# Patient Record
Sex: Male | Born: 1975 | Race: White | Hispanic: No | Marital: Married | Smoking: Former smoker
Health system: Southern US, Community
[De-identification: ages and names within clinical notes are randomized; demographics above are authoritative.]

## PROBLEM LIST (undated history)

## (undated) DIAGNOSIS — E7800 Pure hypercholesterolemia, unspecified: Secondary | ICD-10-CM

## (undated) DIAGNOSIS — I2510 Atherosclerotic heart disease of native coronary artery without angina pectoris: Secondary | ICD-10-CM

## (undated) DIAGNOSIS — I2102 ST elevation (STEMI) myocardial infarction involving left anterior descending coronary artery: Secondary | ICD-10-CM

## (undated) DIAGNOSIS — I509 Heart failure, unspecified: Secondary | ICD-10-CM

## (undated) DIAGNOSIS — I10 Essential (primary) hypertension: Secondary | ICD-10-CM

## (undated) DIAGNOSIS — Z720 Tobacco use: Secondary | ICD-10-CM

## (undated) HISTORY — PX: CORONARY ANGIOPLASTY WITH STENT PLACEMENT: SHX49

## (undated) HISTORY — PX: APPENDECTOMY: SHX54

## (undated) HISTORY — PX: CORONARY ARTERY BYPASS GRAFT: SHX141

---

## 2006-12-20 ENCOUNTER — Emergency Department: Payer: Self-pay | Admitting: Emergency Medicine

## 2008-03-17 ENCOUNTER — Observation Stay: Payer: Self-pay | Admitting: General Surgery

## 2009-02-15 ENCOUNTER — Emergency Department: Payer: Self-pay | Admitting: Unknown Physician Specialty

## 2009-05-15 ENCOUNTER — Emergency Department: Payer: Self-pay | Admitting: Unknown Physician Specialty

## 2011-02-18 ENCOUNTER — Emergency Department: Payer: Self-pay | Admitting: Internal Medicine

## 2011-04-07 ENCOUNTER — Emergency Department: Payer: Self-pay

## 2014-08-12 DIAGNOSIS — I10 Essential (primary) hypertension: Secondary | ICD-10-CM | POA: Insufficient documentation

## 2014-08-12 DIAGNOSIS — Z8639 Personal history of other endocrine, nutritional and metabolic disease: Secondary | ICD-10-CM | POA: Insufficient documentation

## 2014-08-12 DIAGNOSIS — Z87891 Personal history of nicotine dependence: Secondary | ICD-10-CM | POA: Insufficient documentation

## 2014-10-20 DIAGNOSIS — I2102 ST elevation (STEMI) myocardial infarction involving left anterior descending coronary artery: Secondary | ICD-10-CM

## 2014-10-20 HISTORY — DX: ST elevation (STEMI) myocardial infarction involving left anterior descending coronary artery: I21.02

## 2014-10-31 DIAGNOSIS — I2510 Atherosclerotic heart disease of native coronary artery without angina pectoris: Secondary | ICD-10-CM | POA: Insufficient documentation

## 2015-08-04 ENCOUNTER — Emergency Department
Admission: EM | Admit: 2015-08-04 | Discharge: 2015-08-04 | Disposition: A | Discharge status: Home / Self Care | Payer: BLUE CROSS/BLUE SHIELD | Attending: Emergency Medicine | Admitting: Emergency Medicine

## 2015-08-04 DIAGNOSIS — Y9389 Activity, other specified: Secondary | ICD-10-CM | POA: Insufficient documentation

## 2015-08-04 DIAGNOSIS — Y998 Other external cause status: Secondary | ICD-10-CM | POA: Diagnosis not present

## 2015-08-04 DIAGNOSIS — S00412A Abrasion of left ear, initial encounter: Secondary | ICD-10-CM | POA: Insufficient documentation

## 2015-08-04 DIAGNOSIS — X58XXXA Exposure to other specified factors, initial encounter: Secondary | ICD-10-CM | POA: Diagnosis not present

## 2015-08-04 DIAGNOSIS — Y9289 Other specified places as the place of occurrence of the external cause: Secondary | ICD-10-CM | POA: Insufficient documentation

## 2015-08-04 DIAGNOSIS — S0991XA Unspecified injury of ear, initial encounter: Secondary | ICD-10-CM | POA: Diagnosis present

## 2015-08-04 NOTE — ED Provider Notes (Signed)
[facility] Emergency Department Provider Note  ____________________________________________  Time seen: Approximately 9:04 PM  I have reviewed the triage vital signs and the nursing notes.   HISTORY  Chief Complaint Ear Injury    HPI Gregg [name] is a 39 y.o. male who presents to the emergency room for bleeding cut to his ear. He states that he was scratching his ear prior to arrival when he accidentally "knicked" it and it started bleeding. The patient is on blood thinners for cardiac stent placement and the wound bled freely. He states that he applied direct pressure and could not initially get it to stop bleeding. He reports that it was bleeding for approximately an hour. He reports to the emergency room and states that the bleeding subsided while waiting for the provider. Denies any other injury or complaint. He denies any pain.   No past medical history on file.  There are no active problems to display for this patient.   No past surgical history on file.  No current outpatient prescriptions on file.  Allergies Review of patient's allergies indicates no known allergies.  No family history on file.  Social History Social History  Substance Use Topics  . Smoking status: Not on file  . Smokeless tobacco: Not on file  . Alcohol Use: Not on file    Review of Systems Constitutional: No fever/chills Eyes: No visual changes. ENT: No sore throat. Cardiovascular: Denies chest pain. Respiratory: Denies shortness of breath. Gastrointestinal: No abdominal pain.  No nausea, no vomiting.  No diarrhea.  No constipation. Genitourinary: Negative for dysuria. Musculoskeletal: Negative for back pain. Skin: Negative for rash. Positive for cut to left ear. Neurological: Negative for headaches, focal weakness or numbness.  10-point ROS otherwise negative.  ____________________________________________   PHYSICAL EXAM:  VITAL SIGNS: ED Triage  Vitals  Enc Vitals Group     BP 08/04/15 2016 119/83 mmHg     Pulse Rate 08/04/15 2016 73     Resp 08/04/15 2016 18     Temp 08/04/15 2016 97.7 F (36.5 C)     Temp Source 08/04/15 2016 Oral     SpO2 08/04/15 2016 96 %     Weight 08/04/15 2016 195 lb (88.451 kg)     Height 08/04/15 2016 5\' 6"  (1.676 m)     Head Cir --      Peak Flow --      Pain Score --      Pain Loc --      Pain Edu? --      Excl. in GC? --     Constitutional: Alert and oriented. Well appearing and in no acute distress. Eyes: Conjunctivae are normal. PERRL. EOMI. Head: Atraumatic. Nose: No congestion/rhinnorhea. Mouth/Throat: Mucous membranes are moist.  Oropharynx non-erythematous. Neck: No stridor.   Cardiovascular: Normal rate, regular rhythm. Grossly normal heart sounds.  Good peripheral circulation. Respiratory: Normal respiratory effort.  No retractions. Lungs CTAB. Gastrointestinal: Soft and nontender. No distention. No abdominal bruits. No CVA tenderness. Musculoskeletal: No lower extremity tenderness nor edema.  No joint effusions. Neurologic:  Normal speech and language. No gross focal neurologic deficits are appreciated. No gait instability. Skin:  Skin is warm, dry and intact. No rash noted. Small abrasion noted to left pinna. Bleeding is stopped at this time. No visible signs of infection. No tenderness to palpation. Abrasion is a surface deep. No cartilage involved. Psychiatric: Mood and affect are normal. Speech and behavior are normal.  ____________________________________________   LABS (all  labs ordered are listed, but only abnormal results are displayed)  Labs Reviewed - No data to display ____________________________________________  EKG   ____________________________________________  RADIOLOGY   ____________________________________________   PROCEDURES  Procedure(s) performed: yes, laceration repair, see procedure note(s).   LACERATION REPAIR Performed by: Racheal Patches Authorized by: Delorise Royals Krystena Reitter Consent: Verbal consent obtained. Risks and benefits: risks, benefits and alternatives were discussed Consent given by: patient Patient identity confirmed: provided demographic data Prepped and Draped in normal sterile fashion Wound explored  Laceration Location: Left external ear  Laceration Length: 0.5 cm  No Foreign Bodies seen or palpated  Anesthesia: None   Amount of cleaning: standard  Skin closure: Skin adhesive    Patient tolerance: Patient tolerated the procedure well with no immediate complications.   Critical Care performed: No  ____________________________________________   INITIAL IMPRESSION / ASSESSMENT AND PLAN / ED COURSE  Pertinent labs & imaging results that were available during my care of the patient were reviewed by me and considered in my medical decision making (see chart for details).  Patient presents to the emergency department with an abrasion to the left ear. Initially patient was unable to control bleeding due to location and the fact the patient was on blood thinners. However bleeding had stopped prior to interview with provider. Area was cleansed and closed with skin adhesive. Patient informed that he does not need to follow-up with emergency Department last bleeding resumes. Patient verbalizes understanding and compliance with same. ____________________________________________   FINAL CLINICAL IMPRESSION(S) / ED DIAGNOSES  Final diagnoses:  Ear abrasion, left, initial encounter      Racheal Patches, PA-C 08/04/15 2116  Darien Ramus, MD 08/04/15 2308

## 2015-08-04 NOTE — Discharge Instructions (Signed)
Abrasion An abrasion is a cut or scrape on the outer surface of your skin. An abrasion does not extend through all of the layers of your skin. It is important to care for your abrasion properly to prevent infection. CAUSES Most abrasions are caused by falling on or gliding across the ground or another surface. When your skin rubs on something, the outer and inner layer of skin rubs off.  SYMPTOMS A cut or scrape is the main symptom of this condition. The scrape may be bleeding, or it may appear red or pink. If there was an associated fall, there may be an underlying bruise. DIAGNOSIS An abrasion is diagnosed with a physical exam. TREATMENT Treatment for this condition depends on how large and deep the abrasion is. Usually, your abrasion will be cleaned with water and mild soap. This removes any dirt or debris that may be stuck. An antibiotic ointment may be applied to the abrasion to help prevent infection. A bandage (dressing) may be placed on the abrasion to keep it clean. You may also need a tetanus shot. HOME CARE INSTRUCTIONS Medicines  Take or apply medicines only as directed by your health care provider.  If you were prescribed an antibiotic ointment, finish all of it even if you start to feel better. Wound Care  Clean the wound with mild soap and water 2-3 times per day or as directed by your health care provider. Pat your wound dry with a clean towel. Do not rub it.  There are many different ways to close and cover a wound. Follow instructions from your health care provider about:  Wound care.  Dressing changes and removal.  Check your wound every day for signs of infection. Watch for:  Redness, swelling, or pain.  Fluid, blood, or pus. General Instructions  Keep the dressing dry as directed by your health care provider. Do not take baths, swim, use a hot tub, or do anything that would put your wound underwater until your health care provider approves.  If there is  swelling, raise (elevate) the injured area above the level of your heart while you are sitting or lying down.  Keep all follow-up visits as directed by your health care provider. This is important. SEEK MEDICAL CARE IF:  You received a tetanus shot and you have swelling, severe pain, redness, or bleeding at the injection site.  Your pain is not controlled with medicine.  You have increased redness, swelling, or pain at the site of your wound. SEEK IMMEDIATE MEDICAL CARE IF:  You have a red streak going away from your wound.  You have a fever.  You have fluid, blood, or pus coming from your wound.  You notice a bad smell coming from your wound or your dressing.   This information is not intended to replace advice given to you by your health care provider. Make sure you discuss any questions you have with your health care provider.   Document Released: 07/16/2005 Document Revised: 06/27/2015 Document Reviewed: 10/04/2014 Elsevier Interactive Patient Education 2016 [employer] Tissue Adhesive Wound Care    Some cuts, wounds, lacerations, and incisions can be repaired by using tissue adhesive. Tissue adhesive is like glue. It holds the skin together, allowing for faster healing. It forms a strong bond on the skin in about 1 minute and reaches its full strength in about 2 or 3 minutes. The adhesive disappears naturally while the wound is healing. It is important to take proper care of your wound at home  while it heals.  HOME CARE INSTRUCTIONS  Showers are allowed. Do not soak the area containing the tissue adhesive. Do not take baths, swim, or use hot tubs. Do not use any soaps or ointments on the wound. Certain ointments can weaken the glue.  If a bandage (dressing) has been applied, follow your health care provider's instructions for how often to change the dressing.  Keep the dressing dry if one has been applied.  Do not scratch, pick, or rub the adhesive.  Do not place tape over  the adhesive. The adhesive could come off when pulling the tape off.  Protect the wound from further injury until it is healed.  Protect the wound from sun and tanning bed exposure while it is healing and for several weeks after healing.  Only take over-the-counter or prescription medicines as directed by your health care provider.  Keep all follow-up appointments as directed by your health care provider. SEEK IMMEDIATE MEDICAL CARE IF:  Your wound becomes red, swollen, hot, or tender.  You develop a rash after the glue is applied.  You have increasing pain in the wound.  You have a red streak that goes away from the wound.  You have pus coming from the wound.  You have increased bleeding.  You have a fever.  You have shaking chills.  You notice a bad smell coming from the wound.  Your wound or adhesive breaks open.  MAKE SURE YOU:  Understand these instructions.  Will watch your condition.  Will get help right away if you are not doing well or get worse. This information is not intended to replace advice given to you by your health care provider. Make sure you discuss any questions you have with your health care provider.  Document Released: 04/01/2001 Document Revised: 07/27/2013 Document Reviewed: 04/27/2013  Elsevier Interactive Patient Education [employer]

## 2015-08-04 NOTE — ED Notes (Signed)
Pt reports scratching left ear last night. Bleeding stopped prior to going to bed. Reports bleeding started again at approx 7 pm tonight after shower. Unable to stop bleeding. Pt states he is on blood thinner.

## 2015-10-05 ENCOUNTER — Encounter: Payer: Self-pay | Admitting: Emergency Medicine

## 2015-10-05 ENCOUNTER — Emergency Department: Payer: BLUE CROSS/BLUE SHIELD

## 2015-10-05 ENCOUNTER — Emergency Department
Admission: EM | Admit: 2015-10-05 | Discharge: 2015-10-05 | Disposition: A | Discharge status: Other Acute Inpatient Hospital | Payer: BLUE CROSS/BLUE SHIELD | Attending: Emergency Medicine | Admitting: Emergency Medicine

## 2015-10-05 DIAGNOSIS — I214 Non-ST elevation (NSTEMI) myocardial infarction: Secondary | ICD-10-CM

## 2015-10-05 DIAGNOSIS — F17210 Nicotine dependence, cigarettes, uncomplicated: Secondary | ICD-10-CM | POA: Insufficient documentation

## 2015-10-05 DIAGNOSIS — I10 Essential (primary) hypertension: Secondary | ICD-10-CM | POA: Insufficient documentation

## 2015-10-05 DIAGNOSIS — R079 Chest pain, unspecified: Secondary | ICD-10-CM | POA: Diagnosis present

## 2015-10-05 HISTORY — DX: Tobacco use: Z72.0

## 2015-10-05 HISTORY — DX: Essential (primary) hypertension: I10

## 2015-10-05 HISTORY — DX: ST elevation (STEMI) myocardial infarction involving left anterior descending coronary artery: I21.02

## 2015-10-05 HISTORY — DX: Pure hypercholesterolemia, unspecified: E78.00

## 2015-10-05 HISTORY — DX: Atherosclerotic heart disease of native coronary artery without angina pectoris: I25.10

## 2015-10-05 LAB — MAGNESIUM: Magnesium: 1.8 mg/dL (ref 1.7–2.4)

## 2015-10-05 LAB — COMPREHENSIVE METABOLIC PANEL
ALBUMIN: 3.9 g/dL (ref 3.5–5.0)
ALT: 39 U/L (ref 17–63)
ANION GAP: 6 (ref 5–15)
AST: 26 U/L (ref 15–41)
Alkaline Phosphatase: 92 U/L (ref 38–126)
BILIRUBIN TOTAL: 0.6 mg/dL (ref 0.3–1.2)
BUN: 11 mg/dL (ref 6–20)
CO2: 28 mmol/L (ref 22–32)
Calcium: 9.6 mg/dL (ref 8.9–10.3)
Chloride: 106 mmol/L (ref 101–111)
Creatinine, Ser: 0.81 mg/dL (ref 0.61–1.24)
GFR calc Af Amer: >60 mL/min (ref >60)
GFR calc non Af Amer: >60 mL/min (ref >60)
Glucose, Bld: 111 mg/dL — ABNORMAL HIGH (ref 65–99)
POTASSIUM: 3.7 mmol/L (ref 3.5–5.1)
SODIUM: 140 mmol/L (ref 135–145)
TOTAL PROTEIN: 7.1 g/dL (ref 6.5–8.1)

## 2015-10-05 LAB — CBC
HEMATOCRIT: 49.5 % (ref 40.0–52.0)
Hemoglobin: 16.4 g/dL (ref 13.0–18.0)
MCH: 30 pg (ref 26.0–34.0)
MCHC: 33.1 g/dL (ref 32.0–36.0)
MCV: 90.6 fL (ref 80.0–100.0)
Platelets: 264 10*3/uL (ref 150–440)
RBC: 5.46 MIL/uL (ref 4.40–5.90)
RDW: 13.8 % (ref 11.5–14.5)
WBC: 9.8 10*3/uL (ref 3.8–10.6)

## 2015-10-05 LAB — APTT: APTT: 32 s (ref 24–36)

## 2015-10-05 LAB — PROTIME-INR
INR: 0.99
Prothrombin Time: 13.3 seconds (ref 11.4–15.0)

## 2015-10-05 LAB — TROPONIN I: TROPONIN I: 0.07 ng/mL — AB (ref <0.031)

## 2015-10-05 MED ORDER — NITROGLYCERIN 2 % TD OINT: 1.0000 [in_us] | TOPICAL_OINTMENT | Freq: Once | TRANSDERMAL | Status: DC

## 2015-10-05 MED ORDER — HEPARIN (PORCINE) IN NACL 100-0.45 UNIT/ML-% IJ SOLN
1050.0000 [IU]/h | INTRAMUSCULAR | Status: DC
  Administered 2015-10-05 (×2): 1050 [IU]/h via INTRAVENOUS
  Filled 2015-10-05: qty 250

## 2015-10-05 MED ORDER — NITROGLYCERIN 0.4 MG SL SUBL
0.4000 mg | SUBLINGUAL_TABLET | SUBLINGUAL | Status: DC | PRN
  Filled 2015-10-05: qty 1

## 2015-10-05 MED ORDER — MORPHINE SULFATE (PF) 4 MG/ML IV SOLN
4.0000 mg | Freq: Once | INTRAVENOUS | Status: AC
  Administered 2015-10-05: 4 mg via INTRAVENOUS
  Filled 2015-10-05: qty 1

## 2015-10-05 MED ORDER — ONDANSETRON HCL 4 MG/2ML IJ SOLN
4.0000 mg | Freq: Once | INTRAMUSCULAR | Status: AC
  Administered 2015-10-05: 4 mg via INTRAVENOUS
  Filled 2015-10-05: qty 2

## 2015-10-05 MED ORDER — HEPARIN BOLUS VIA INFUSION
4000.0000 [IU] | Freq: Once | INTRAVENOUS | Status: AC
  Administered 2015-10-05: 4000 [IU] via INTRAVENOUS
  Filled 2015-10-05: qty 4000

## 2015-10-05 MED ORDER — TICAGRELOR 90 MG PO TABS
180.0000 mg | ORAL_TABLET | ORAL | Status: AC
  Administered 2015-10-05: 180 mg via ORAL
  Filled 2015-10-05: qty 2

## 2015-10-05 MED ORDER — ASPIRIN 81 MG PO CHEW
324.0000 mg | CHEWABLE_TABLET | Freq: Once | ORAL | Status: AC
  Administered 2015-10-05: 324 mg via ORAL
  Filled 2015-10-05: qty 4

## 2015-10-05 NOTE — ED Notes (Signed)
Dr York CeriseForbach notified of pt's troponin results

## 2015-10-05 NOTE — ED Notes (Signed)
Pt in with co chest pain since yest, with shob, no diaphoresis.

## 2015-10-05 NOTE — ED Notes (Signed)
Pt uprite on stretcher in exam room with no distress noted; face flushed; pt reports hx angina; rx imdur 6mos ago with significant decrease in episodes; last few wks had returning of such; ran out of med yesterday and since has been having intermittent periods of upper CP radiating into neck/arms/wrist accomp by SOB; resp even/unlab, lungs clear, apical audible and regular

## 2015-10-05 NOTE — ED Provider Notes (Signed)
[facility] Emergency Department Provider Note  ____________________________________________  Time seen: Approximately 5:29 AM  I have reviewed the triage vital signs and the nursing notes.   HISTORY  Chief Complaint Chest Pain    HPI Gregg [name] is a 39 y.o. male with a history of severe CAD s/p STEMI last year with stent in LAD and known CAD in another vessel (patient cannot remember which one) who presents with severe central chest pain radiating into neck for the last several hours.  He reports he ran out of his Imdur and his NTG so he did not have any to take yesterday.  He had episodic angina throughout the day, which is common for him, but it did not get better immediately with rest.  During the night he had acute onset of severe pain, much worse than prior, which at rest, so he came to the ED.  Currently he has not chest pain free although it has improved from before.  He is having some shortness of breath associated with the sharp stabbing central chest pain.  He denies nausea/vomiting, abdominal pain, headache, fever/chills.  He has not had any recent cough or congestion.   Past Medical History  Diagnosis Date  . Hypertension   . High cholesterol   . ST elevation (STEMI) myocardial infarction involving left anterior descending coronary artery [facility]) Jan 2016  . Tobacco use   . Coronary artery disease     There are no active problems to display for this patient.   Past Surgical History  Procedure Laterality Date  . Appendectomy    . Coronary angioplasty with stent placement      No current outpatient prescriptions on file.  Allergies Review of patient's allergies indicates no known allergies.  History reviewed. No pertinent family history.  Social History Social History  Substance Use Topics  . Smoking status: Current Every Day Smoker -- 1.00 packs/day    Types: Cigarettes  . Smokeless tobacco: None  . Alcohol Use: Yes      Comment: occasional    Review of Systems Constitutional: No fever/chills Eyes: No visual changes. ENT: No sore throat. Cardiovascular: severe central sharp chest pain occurring at rest Respiratory: are as of breath associated with chest pain Gastrointestinal: No abdominal pain.  No nausea, no vomiting.  No diarrhea.  No constipation. Genitourinary: Negative for dysuria. Musculoskeletal: Negative for back pain. Skin: Negative for rash. Neurological: Negative for headaches, focal weakness or numbness.  10-point ROS otherwise negative.  ____________________________________________   PHYSICAL EXAM:  VITAL SIGNS: ED Triage Vitals  Enc Vitals Group     BP 10/05/15 0516 135/81 mmHg     Pulse Rate 10/05/15 0516 89     Resp 10/05/15 0516 18     Temp 10/05/15 0516 98 F (36.7 C)     Temp Source 10/05/15 0516 Oral     SpO2 10/05/15 0516 97 %     Weight 10/05/15 0515 205 lb (92.987 kg)     Height 10/05/15 0515  (1.676 m)     Head Cir --      Peak Flow --      Pain Score --      Pain Loc --      Pain Edu? --      Excl. in GC? --     Constitutional: Alert and oriented. Generally well-appearing but is somewhat uncomfortable Eyes: Conjunctivae are normal. PERRL. EOMI. Head: Atraumatic. Nose: No congestion/rhinnorhea. Mouth/Throat: Mucous membranes are moist.  Oropharynx  non-erythematous. Neck: No stridor.   Cardiovascular: Normal rate, regular rhythm. Grossly normal heart sounds.  Good peripheral circulation. Respiratory: Normal respiratory effort.  No retractions. Lungs CTAB. Gastrointestinal: Soft and nontender. No distention. No abdominal bruits. No CVA tenderness. Musculoskeletal: No lower extremity tenderness nor edema.  No joint effusions. Neurologic:  Normal speech and language. No gross focal neurologic deficits are appreciated.  Skin:  Skin is warm, dry and intact. No rash noted.   ____________________________________________   LABS (all labs ordered are  listed, but only abnormal results are displayed)  Labs Reviewed  COMPREHENSIVE METABOLIC PANEL - Abnormal; Notable for the following:    Glucose, Bld 111 (*)    All other components within normal limits  TROPONIN I - Abnormal; Notable for the following:    Troponin I 0.07 (*)    All other components within normal limits  APTT  CBC  MAGNESIUM  PROTIME-INR   ____________________________________________  EKG  ED ECG REPORT I, Aziya Arena, the attending physician, personally viewed and interpreted this ECG.  Date: 10/05/2015 EKG Time: 05:17 Rate: 72 Rhythm: normal sinus rhythm QRS Axis: normal Intervals: normal ST/T Wave abnormalities: Non-specific ST segment / T-wave changes; approximately 1 mm of ST depression in lead 1 and 2, inverted T-wave in lead 3, inverted T-wave in lead V6 Conduction Disutrbances: none Narrative Interpretation: non-specific but may represent acute ischemia or old change after STEMI last year  ____________________________________________  RADIOLOGY   Dg Chest 2 View  10/05/2015  CLINICAL DATA:  Acute onset of shortness of breath and facial flushing. Intermittent upper chest pain. Initial encounter. EXAM: CHEST  2 VIEW COMPARISON:  Chest radiograph performed 02/15/2009 FINDINGS: The lungs are well-aerated. Pulmonary vascularity is at the upper limits of normal. There is no evidence of focal opacification, pleural effusion or pneumothorax. The heart is normal in size; the mediastinal contour is within normal limits. No acute osseous abnormalities are seen. IMPRESSION: No acute cardiopulmonary process seen. Electronically Signed   By: Roanna RaiderJeffery  Chang M.D.   On: 10/05/2015 06:03    ____________________________________________   PROCEDURES  Procedure(s) performed: None  Critical Care performed: Yes, see critical care note(s)   CRITICAL CARE Performed by: Loleta RoseFORBACH, Merlin Ege   Total critical care time: 40 minutes  Critical care time was exclusive of  separately billable procedures and treating other patients.  Critical care was necessary to treat or prevent imminent or life-threatening deterioration.  Critical care was time spent personally by me on the following activities: development of treatment plan with patient and/or surrogate as well as nursing, discussions with consultants, evaluation of patient's response to treatment, examination of patient, obtaining history from patient or surrogate, ordering and performing treatments and interventions, ordering and review of laboratory studies, ordering and review of radiographic studies, pulse oximetry and re-evaluation of patient's condition.  ____________________________________________   INITIAL IMPRESSION / ASSESSMENT AND PLAN / ED COURSE  Pertinent labs & imaging results that were available during my care of the patient were reviewed by me and considered in my medical decision making (see chart for details).  Initially concerned for unstable angina, but +troponin returned while I was in the room with the patient.  Given the patient's history, I believe that he needs admission for NSTEMI.  [facility] and this is appropriate given that he has an existing patient at that facility.  I called and spoke with the transfer center and the patient was accepted by Dr. Ladonna SnideZach Loring (fellow) on behalf of Dr. Molly Maduroobert  Romeo Apple (attending).  He agreed with my current management of a full dose aspirin, heparin bolus and infusion, and nitroglycerin for chest pain.  He also asked that I order ticagrelor 180 mg to administer in the ED prior to transfer if possible, which I have done.    After 1 dose NTG 0.4 mg SL, the patient's chest pain resolved.  I have ordered 1 inch NTG paste.  Patient and wife are up to date with plan. ____________________________________________  FINAL CLINICAL IMPRESSION(S) / ED DIAGNOSES  Final diagnoses:  NSTEMI (non-ST elevated myocardial infarction) [facility])       NEW MEDICATIONS STARTED DURING THIS VISIT:  New Prescriptions   No medications on file     Loleta Rose, MD 10/05/15 [phone]

## 2015-10-07 DIAGNOSIS — I5022 Chronic systolic (congestive) heart failure: Secondary | ICD-10-CM | POA: Insufficient documentation

## 2015-10-07 DIAGNOSIS — I214 Non-ST elevation (NSTEMI) myocardial infarction: Secondary | ICD-10-CM | POA: Insufficient documentation

## 2015-10-11 HISTORY — PX: OTHER SURGICAL HISTORY: SHX169

## 2015-10-16 ENCOUNTER — Emergency Department: Payer: BLUE CROSS/BLUE SHIELD

## 2015-10-16 ENCOUNTER — Ambulatory Visit (HOSPITAL_COMMUNITY)
Admission: AD | Admit: 2015-10-16 | Discharge: 2015-10-16 | Disposition: A | Discharge status: Home / Self Care | Payer: BLUE CROSS/BLUE SHIELD | Source: Other Acute Inpatient Hospital | Attending: Emergency Medicine | Admitting: Emergency Medicine

## 2015-10-16 ENCOUNTER — Emergency Department
Admission: EM | Admit: 2015-10-16 | Discharge: 2015-10-16 | Disposition: A | Discharge status: Other Acute Inpatient Hospital | Payer: BLUE CROSS/BLUE SHIELD | Attending: Emergency Medicine | Admitting: Emergency Medicine

## 2015-10-16 DIAGNOSIS — R220 Localized swelling, mass and lump, head: Secondary | ICD-10-CM | POA: Diagnosis not present

## 2015-10-16 DIAGNOSIS — Y9289 Other specified places as the place of occurrence of the external cause: Secondary | ICD-10-CM | POA: Insufficient documentation

## 2015-10-16 DIAGNOSIS — I10 Essential (primary) hypertension: Secondary | ICD-10-CM | POA: Insufficient documentation

## 2015-10-16 DIAGNOSIS — I82402 Acute embolism and thrombosis of unspecified deep veins of left lower extremity: Secondary | ICD-10-CM | POA: Diagnosis not present

## 2015-10-16 DIAGNOSIS — Y658 Other specified misadventures during surgical and medical care: Secondary | ICD-10-CM | POA: Insufficient documentation

## 2015-10-16 DIAGNOSIS — R2243 Localized swelling, mass and lump, lower limb, bilateral: Secondary | ICD-10-CM | POA: Diagnosis present

## 2015-10-16 DIAGNOSIS — Y998 Other external cause status: Secondary | ICD-10-CM | POA: Diagnosis not present

## 2015-10-16 DIAGNOSIS — Y9389 Activity, other specified: Secondary | ICD-10-CM | POA: Diagnosis not present

## 2015-10-16 DIAGNOSIS — X58XXXA Exposure to other specified factors, initial encounter: Secondary | ICD-10-CM | POA: Diagnosis not present

## 2015-10-16 DIAGNOSIS — R778 Other specified abnormalities of plasma proteins: Secondary | ICD-10-CM

## 2015-10-16 DIAGNOSIS — R7989 Other specified abnormal findings of blood chemistry: Secondary | ICD-10-CM | POA: Insufficient documentation

## 2015-10-16 DIAGNOSIS — M9689 Other intraoperative and postprocedural complications and disorders of the musculoskeletal system: Secondary | ICD-10-CM | POA: Insufficient documentation

## 2015-10-16 DIAGNOSIS — M7989 Other specified soft tissue disorders: Secondary | ICD-10-CM | POA: Diagnosis present

## 2015-10-16 DIAGNOSIS — F17210 Nicotine dependence, cigarettes, uncomplicated: Secondary | ICD-10-CM | POA: Diagnosis not present

## 2015-10-16 DIAGNOSIS — L03114 Cellulitis of left upper limb: Secondary | ICD-10-CM | POA: Diagnosis not present

## 2015-10-16 DIAGNOSIS — R000 Tachycardia, unspecified: Secondary | ICD-10-CM | POA: Diagnosis not present

## 2015-10-16 DIAGNOSIS — J939 Pneumothorax, unspecified: Secondary | ICD-10-CM | POA: Diagnosis not present

## 2015-10-16 DIAGNOSIS — S8012XA Contusion of left lower leg, initial encounter: Secondary | ICD-10-CM | POA: Insufficient documentation

## 2015-10-16 HISTORY — DX: Heart failure, unspecified: I50.9

## 2015-10-16 LAB — COMPREHENSIVE METABOLIC PANEL
ALT: 33 U/L (ref 17–63)
ANION GAP: 8 (ref 5–15)
AST: 49 U/L — ABNORMAL HIGH (ref 15–41)
Albumin: 3.4 g/dL — ABNORMAL LOW (ref 3.5–5.0)
Alkaline Phosphatase: 73 U/L (ref 38–126)
BILIRUBIN TOTAL: 0.8 mg/dL (ref 0.3–1.2)
BUN: 17 mg/dL (ref 6–20)
CHLORIDE: 99 mmol/L — AB (ref 101–111)
CO2: 25 mmol/L (ref 22–32)
Calcium: 8.4 mg/dL — ABNORMAL LOW (ref 8.9–10.3)
Creatinine, Ser: 0.81 mg/dL (ref 0.61–1.24)
GFR calc Af Amer: >60 mL/min (ref >60)
GFR calc non Af Amer: >60 mL/min (ref >60)
GLUCOSE: 102 mg/dL — AB (ref 65–99)
POTASSIUM: 3.8 mmol/L (ref 3.5–5.1)
Sodium: 132 mmol/L — ABNORMAL LOW (ref 135–145)
Total Protein: 6.8 g/dL (ref 6.5–8.1)

## 2015-10-16 LAB — APTT: aPTT: 27 seconds (ref 24–36)

## 2015-10-16 LAB — PROTIME-INR
INR: 1.28
Prothrombin Time: 16.1 seconds — ABNORMAL HIGH (ref 11.4–15.0)

## 2015-10-16 LAB — URINALYSIS COMPLETE WITH MICROSCOPIC (ARMC ONLY)
Bacteria, UA: NONE SEEN
Bilirubin Urine: NEGATIVE
Glucose, UA: NEGATIVE mg/dL
Hgb urine dipstick: NEGATIVE
KETONES UR: NEGATIVE mg/dL
Leukocytes, UA: NEGATIVE
NITRITE: NEGATIVE
PROTEIN: NEGATIVE mg/dL
RBC / HPF: NONE SEEN RBC/hpf (ref 0–5)
SPECIFIC GRAVITY, URINE: 1.042 — AB (ref 1.005–1.030)
Squamous Epithelial / LPF: NONE SEEN
pH: 6 (ref 5.0–8.0)

## 2015-10-16 LAB — CBC WITH DIFFERENTIAL/PLATELET
BASOS ABS: 0.1 10*3/uL (ref 0–0.1)
BASOS PCT: 1 %
EOS ABS: 0.3 10*3/uL (ref 0–0.7)
Eosinophils Relative: 3 %
HCT: 32.1 % — ABNORMAL LOW (ref 40.0–52.0)
HEMOGLOBIN: 10.8 g/dL — AB (ref 13.0–18.0)
LYMPHS ABS: 1.6 10*3/uL (ref 1.0–3.6)
LYMPHS PCT: 12 %
MCH: 30.1 pg (ref 26.0–34.0)
MCHC: 33.6 g/dL (ref 32.0–36.0)
MCV: 89.5 fL (ref 80.0–100.0)
MONOS PCT: 14 %
Monocytes Absolute: 1.8 10*3/uL — ABNORMAL HIGH (ref 0.2–1.0)
NEUTROS ABS: 9.3 10*3/uL — AB (ref 1.4–6.5)
NEUTROS PCT: 70 %
Platelets: 329 10*3/uL (ref 150–440)
RBC: 3.59 MIL/uL — AB (ref 4.40–5.90)
RDW: 13.6 % (ref 11.5–14.5)
WBC: 13 10*3/uL — ABNORMAL HIGH (ref 3.8–10.6)

## 2015-10-16 LAB — BLOOD GAS, VENOUS
PCO2 VEN: 39 mmHg — AB (ref 44.0–60.0)
PH VEN: 7.45 — AB (ref 7.320–7.430)
Patient temperature: 37

## 2015-10-16 LAB — TROPONIN I: Troponin I: 0.57 ng/mL — ABNORMAL HIGH (ref <0.031)

## 2015-10-16 LAB — LACTIC ACID, PLASMA: LACTIC ACID, VENOUS: 0.9 mmol/L (ref 0.5–2.0)

## 2015-10-16 MED ORDER — SODIUM CHLORIDE 0.9 % IV SOLN
Freq: Once | INTRAVENOUS | Status: AC
  Administered 2015-10-16: 12:00:00 via INTRAVENOUS

## 2015-10-16 MED ORDER — ADENOSINE 6 MG/2ML IV SOLN: 6.0000 mg | Freq: Once | INTRAVENOUS | Status: DC

## 2015-10-16 MED ORDER — HYDROMORPHONE HCL 1 MG/ML IJ SOLN
INTRAMUSCULAR | Status: AC
  Administered 2015-10-16: 1 mg via INTRAVENOUS
  Filled 2015-10-16: qty 1

## 2015-10-16 MED ORDER — HEPARIN (PORCINE) IN NACL 100-0.45 UNIT/ML-% IJ SOLN: 12.0000 [IU]/kg/h | Freq: Once | INTRAMUSCULAR | Status: DC

## 2015-10-16 MED ORDER — HEPARIN (PORCINE) IN NACL 100-0.45 UNIT/ML-% IJ SOLN
17.0000 [IU]/kg/h | INTRAMUSCULAR | Status: DC
  Administered 2015-10-16: 17 [IU]/kg/h via INTRAVENOUS
  Filled 2015-10-16: qty 250

## 2015-10-16 MED ORDER — IOHEXOL 350 MG/ML SOLN
75.0000 mL | Freq: Once | INTRAVENOUS | Status: AC | PRN
  Administered 2015-10-16: 75 mL via INTRAVENOUS

## 2015-10-16 MED ORDER — HEPARIN SODIUM (PORCINE) 5000 UNIT/ML IJ SOLN: 4000.0000 [IU] | Freq: Once | INTRAMUSCULAR | Status: DC

## 2015-10-16 MED ORDER — HEPARIN BOLUS VIA INFUSION
5000.0000 [IU] | Freq: Once | INTRAVENOUS | Status: AC
  Administered 2015-10-16: 5000 [IU] via INTRAVENOUS
  Filled 2015-10-16: qty 5000

## 2015-10-16 MED ORDER — HYDROMORPHONE HCL 1 MG/ML IJ SOLN
1.0000 mg | Freq: Once | INTRAMUSCULAR | Status: AC
  Administered 2015-10-16: 1 mg via INTRAVENOUS

## 2015-10-16 NOTE — ED Provider Notes (Addendum)
[facility] Emergency Department Provider Note     Time seen: ----------------------------------------- 9:45 AM on 10/16/2015 -----------------------------------------    I have reviewed the triage vital signs and the nursing notes.   HISTORY  Chief Complaint Leg Swelling and Post-op Problem    HPI Gregg [name] is a 39 y.o. male who presents status post leg swelling and bilateral arm pain with swelling. Patient had cardiac bypass on Thursday last week at [facility]. Patient states when he left he began having right hand and left arm erythema and pain. Patient was given a heat pack and was told this was normal. He subsequently went home setting increased pain and swelling of the arms is noted ecchymosis in his left lower extremity. Left legs where they took the vein graft. Patient denies any fevers, has had no chest pain but does have some shortness of breath.   Past Medical History  Diagnosis Date  . Hypertension   . High cholesterol   . ST elevation (STEMI) myocardial infarction involving left anterior descending coronary artery [facility]) Jan 2016  . Tobacco use   . Coronary artery disease     There are no active problems to display for this patient.   Past Surgical History  Procedure Laterality Date  . Appendectomy    . Coronary angioplasty with stent placement    . Cardiac bi pass  10/11/2015    Allergies Review of patient's allergies indicates no known allergies.  Social History Social History  Substance Use Topics  . Smoking status: Current Every Day Smoker -- 1.00 packs/day    Types: Cigarettes  . Smokeless tobacco: None  . Alcohol Use: Yes     Comment: occasional    Review of Systems Constitutional: Negative for fever. Eyes: Negative for visual changes. ENT: Negative for sore throat. Cardiovascular: Negative for chest pain. Respiratory: Positive for shortness of breath Gastrointestinal: Negative for abdominal pain, vomiting and  diarrhea. Genitourinary: Negative for dysuria. Musculoskeletal: Negative for back pain. Positive for edema in bilateral upper extremities, much worse in the left. Skin: Positive for left arm erythema and right hand erythema, positive for ecchymosis left lower extremity Neurological: Negative for headaches, focal weakness or numbness.  10-point ROS otherwise negative.  ____________________________________________   PHYSICAL EXAM:  VITAL SIGNS: ED Triage Vitals  Enc Vitals Group     BP 10/16/15 0910 124/78 mmHg     Pulse Rate 10/16/15 0910 111     Resp 10/16/15 0910 18     Temp 10/16/15 0910 99.7 F (37.6 C)     Temp Source 10/16/15 0910 Oral     SpO2 10/16/15 0910 91 %     Weight 10/16/15 0910 205 lb (92.987 kg)     Height 10/16/15 0910  (1.676 m)     Head Cir --      Peak Flow --      Pain Score 10/16/15 0912 8     Pain Loc --      Pain Edu? --      Excl. in GC? --     Constitutional: Alert and oriented. Well appearing and in no distress. Eyes: Conjunctivae are normal. PERRL. Normal extraocular movements. ENT   Head: Normocephalic and atraumatic.   Nose: No congestion/rhinnorhea.   Mouth/Throat: Mucous membranes are moist.   Neck: No stridor. Cardiovascular: Rapid rate, regular rhythm. Normal and symmetric distal pulses are present in all extremities. No murmurs, rubs, or gallops. Respiratory: Normal respiratory effort without tachypnea nor retractions. Breath sounds are  clear and equal bilaterally. No wheezes/rales/rhonchi. Gastrointestinal: Soft and nontender. No distention. No abdominal bruits.  Musculoskeletal: Left upper extremity edema and erythema, there is pain with range of motion left upper extremity. Right hand erythema and edema is noted as well. Left lower extremity ecchymosis. Good peripheral pulses. Neurologic:  Normal speech and language. No gross focal neurologic deficits are appreciated. Speech is normal. No gait instability. Skin:   Marked erythema over the dorsum of the right hand and over the antecubital fossa that tracked superiorly consistent with either cellulitis or phlebitis. Left upper extremity is swollen and tender to touch as is the dorsum of the right hand. His left lower extremity medially has extensive ecchymosis likely status post vein excision Psychiatric: Mood and affect are normal. Speech and behavior are normal. Patient exhibits appropriate insight and judgment. ____________________________________________  EKG: Interpreted by me. Sinus tachycardia with a rate of 109 bpm, normal PR interval, normal QRS with, long QT interval. Nonspecific T-wave changes.  ____________________________________________  ED COURSE:  Pertinent labs & imaging results that were available during my care of the patient were reviewed by me and considered in my medical decision making (see chart for details). Patient with apparent cellulitis and/or phlebitis. I will obtain labs and he will likely need readmission ____________________________________________    LABS (pertinent positives/negatives)  Labs Reviewed  COMPREHENSIVE METABOLIC PANEL - Abnormal; Notable for the following:    Sodium 132 (*)    Chloride 99 (*)    Glucose, Bld 102 (*)    Calcium 8.4 (*)    Albumin 3.4 (*)    AST 49 (*)    All other components within normal limits  TROPONIN I - Abnormal; Notable for the following:    Troponin I 0.57 (*)    All other components within normal limits  CBC WITH DIFFERENTIAL/PLATELET - Abnormal; Notable for the following:    WBC 13.0 (*)    RBC 3.59 (*)    Hemoglobin 10.8 (*)    HCT 32.1 (*)    Neutro Abs 9.3 (*)    Monocytes Absolute 1.8 (*)    All other components within normal limits  BLOOD GAS, VENOUS - Abnormal; Notable for the following:    pH, Ven 7.45 (*)    pCO2, Ven 39 (*)    All other components within normal limits  URINE CULTURE  CULTURE, BLOOD (ROUTINE X 2)  CULTURE, BLOOD (ROUTINE X 2)  LACTIC  ACID, PLASMA  LACTIC ACID, PLASMA  URINALYSIS COMPLETEWITH MICROSCOPIC ([facility] ONLY)   CRITICAL CARE Performed by: Emily Filbert   Total critical care time: 30 minutes  Critical care time was exclusive of separately billable procedures and treating other patients.  Critical care was necessary to treat or prevent imminent or life-threatening deterioration.  Critical care was time spent personally by me on the following activities: development of treatment plan with patient and/or surrogate as well as nursing, discussions with consultants, evaluation of patient's response to treatment, examination of patient, obtaining history from patient or surrogate, ordering and performing treatments and interventions, ordering and review of laboratory studies, ordering and review of radiographic studies, pulse oximetry and re-evaluation of patient's condition.   RADIOLOGY Images were viewed by me  Chest x-ray, upper extremity Doppler. IMPRESSION: No evidence of deep venous thrombosis on the right. On the left, there is acute deep venous thrombosis in a branch of the left brachial vein, a deep structure. There is acute thrombus in much of the left cephalic and basilic veins, superficial venous  structures. The remainder the left upper extremity venous system appears Unremarkable. IMPRESSION: No demonstrable pulmonary embolus.  Small pneumothorax in the left apical region without tension component.  Air in the lower anterior mediastinum is likely postoperative in etiology. A small amount of air in the pericardial region likewise is felt to be of postoperative etiology. If patient continues to have clinical symptoms, a follow-up CT to assess for resolution of these foci of air would certainly be warranted ; conceivably, infectious lesion could present in this manner.  There is bibasilar airspace consolidation with small effusions. This finding is likely due to compressive atelectasis  given the recent surgery, although a degree of superimposed pneumonia cannot be excluded radiographically.  5 mm nodular opacity left upper lobe. Followup of this nodular opacity should be based on Fleischner Society guidelines. If the patient is at high risk for bronchogenic carcinoma, follow-up chest CT at 6-12 months is recommended. If the patient is at low risk for bronchogenic carcinoma, follow-up chest CT at 12 months is recommended. This recommendation follows the consensus statement: Guidelines for Management of Small Pulmonary Nodules Detected on CT Scans: A Statement from the Fleischner Society as published in Radiology 2005;237:395-400.  Small mediastinal lymph nodes are likely of postoperative etiology. No frank adenopathy by size criteria. ____________________________________________  FINAL ASSESSMENT AND PLAN  Postop swelling and pain, cellulitis, DVT  Plan: Patient with labs and imaging as dictated above. Patient with acute DVT in the left upper extremity. We'll obtain CT angiogram for further evaluation.   Emily FilbertWilliams, Jonathan E, MD She has been accepted in transfer by Dr. Kennon RoundsHaney. He is currently stable and on a heparin drip.  Emily FilbertJonathan E Williams, MD 10/16/15 1222  Emily FilbertJonathan E Williams, MD 10/16/15 1256  Emily FilbertJonathan E Williams, MD 10/16/15 [phone]

## 2015-10-16 NOTE — ED Notes (Signed)
Pt to US.

## 2015-10-16 NOTE — Progress Notes (Signed)
ANTICOAGULATION CONSULT NOTE - Initial Consult  Pharmacy Consult for heparin drip Indication: VTE treatment  No Known Allergies  Patient Measurements: Height: 5\' 6"  (167.6 cm) Weight: 205 lb (92.987 kg) IBW/kg (Calculated) : 63.8 Heparin Dosing Weight: 83.7 kg  Vital Signs: Temp: 99.7 F (37.6 C) (12/27 0914) Temp Source: Oral (12/27 0914) BP: 113/56 mmHg (12/27 1000) Pulse Rate: 103 (12/27 1000)  Labs:  Recent Labs  10/16/15 0942  HGB 10.8*  HCT 32.1*  PLT 329  CREATININE 0.81  TROPONINI 0.57*   Estimated Creatinine Clearance: 128.2 mL/min (by C-G formula based on Cr of 0.81).  Assessment: Pharmacy consulted to dose heparin in this 39 year old male for VTE treatment. Patient presents with leg swelling and bilateral arm pain/swelling. Dopplar reveals DVT of left brachial vein. Patient was not taking anticoagulants prior to admission per med rec.   Heparin dosing weight = 83.7 kg  Goal of Therapy:  Heparin level 0.3-0.7 units/ml Monitor platelets by anticoagulation protocol: Yes   Plan:  Give 5000 units bolus x 1 (~ 60 units/kg) Start heparin infusion at 1400 units/hr (~17 units/kg/hr) Check anti-Xa level in 6 hours and daily while on heparin Continue to monitor H&H and platelets  Pharmacy will continue to monitor, thank you for the consult.  Jodelle RedMary M Treylan Mcclintock 10/16/2015,1:29 PM

## 2015-10-16 NOTE — ED Notes (Signed)
[facility] and is c/o leg swelling and has noted redness BL arms with swelling..Marland Kitchen

## 2015-10-17 DIAGNOSIS — I82A12 Acute embolism and thrombosis of left axillary vein: Secondary | ICD-10-CM | POA: Insufficient documentation

## 2015-10-18 LAB — URINE CULTURE: Culture: NO GROWTH

## 2015-10-21 LAB — CULTURE, BLOOD (ROUTINE X 2)
CULTURE: NO GROWTH
CULTURE: NO GROWTH

## 2015-12-20 ENCOUNTER — Encounter: Payer: BLUE CROSS/BLUE SHIELD | Attending: Cardiology | Admitting: *Deleted

## 2015-12-20 ENCOUNTER — Encounter: Payer: Self-pay | Admitting: *Deleted

## 2015-12-20 VITALS — BP 138/82 | HR 78 | Ht 65.6 in | Wt 195.8 lb

## 2015-12-20 DIAGNOSIS — Z951 Presence of aortocoronary bypass graft: Secondary | ICD-10-CM | POA: Diagnosis not present

## 2015-12-20 NOTE — Patient Instructions (Signed)
Patient Instructions  Patient Details  Name: Gregg [name] MRN: [id number] Date of Birth: 1975-12-29 Referring Provider:  Abelina Bachelor, MD  Below are the personal goals you chose as well as exercise and nutrition goals. Our goal is to help you keep on track towards obtaining and maintaining your goals. We will be discussing your progress on these goals with you throughout the program.  Initial Exercise Prescription:     Initial Exercise Prescription - 12/20/15 1400    Date of Initial Exercise Prescription   Date 12/20/15   Treadmill   MPH 3   Grade 0   Minutes 10   Recumbant Bike   Level 2   RPM 40   Watts 20   Minutes 10   NuStep   Level 2   Watts 50   Minutes 10   Arm Ergometer   Level 1   Watts 10   Minutes 10   Recumbant Elliptical   Level 3   RPM 45   Watts 25   Minutes 10   Elliptical   Level 1   Speed 3   Minutes 1   REL-XR   Level 3   Watts 60   Minutes 10   T5 Nustep   Level 2   Watts 25   Minutes 10   Biostep-RELP   Level 3   Watts 60   Minutes 10   Prescription Details   Frequency (times per week) 3   Duration Progress to 30 minutes of continuous aerobic without signs/symptoms of physical distress   Intensity   THRR REST +  30   Ratings of Perceived Exertion 11-15   Perceived Dyspnea 2-4   Progression   Progression Continue progressive overload as per policy without signs/symptoms or physical distress.   Resistance Training   Training Prescription Yes   Weight 2   Reps 10-15      Exercise Goals: Frequency: Be able to perform aerobic exercise three times per week working toward 3-5 days per week.  Intensity: Work with a perceived exertion of 11 (fairly light) - 15 (hard) as tolerated. Follow your new exercise prescription and watch for changes in prescription as you progress with the program. Changes will be reviewed with you when they are made.  Duration: You should be able to do 30 minutes of continuous aerobic exercise  in addition to a 5 minute warm-up and a 5 minute cool-down routine.  Nutrition Goals: Your personal nutrition goals will be established when you do your nutrition analysis with the dietician.  The following are nutrition guidelines to follow: Cholesterol < /day Sodium < /day Fiber: Men under 50 yrs - 38 grams per day  Personal Goals:     Personal Goals and Risk Factors at Admission - 12/20/15 1433    Core Components/Risk Factors/Patient Goals on Admission   Tobacco Cessation Yes   Expected Outcomes Long Term: Complete abstinence from all tobacco products for at least 12 months from quit date.  Short term-cont to abstain for smoking-Quit Oct 05, 2015.   Hypertension Yes   Intervention Provide education on lifestyle modifcations including regular physical activity/exercise, weight management, moderate sodium restriction and increased consumption of fresh fruit, vegetables, and low fat dairy, alcohol moderation, and smoking cessation.;Monitor prescription use compliance.   Expected Outcomes Short Term: Continued assessment and intervention until BP is < 140/47mm HG in hypertensive participants. < 130/91mm HG in hypertensive participants with diabetes, heart failure or chronic kidney disease.;Long Term: Maintenance of blood pressure at  goal levels.   Lipids Yes   Intervention Provide education and support for participant on nutrition & aerobic/resistive exercise along with prescribed medications to achieve LDL 70mg , HDL >40mg .   Expected Outcomes Short Term: Participant states understanding of desired cholesterol values and is compliant with medications prescribed. Participant is following exercise prescription and nutrition guidelines.;Long Term: Cholesterol controlled with medications as prescribed, with individualized exercise RX and with personalized nutrition plan. Value goals: LDL < , HDL > 40 mg.      Tobacco Use Initial Evaluation: History  Smoking status  . Former  Smoker -- 1.50 packs/day for 20 years  . Types: Cigarettes  . Quit date: 10/05/2015  Smokeless tobacco  . Not on file    Copy of goals given to participant.

## 2015-12-20 NOTE — Progress Notes (Signed)
Cardiac Individual Treatment Plan  Patient Details  Name: Gregg [name] MRN: [id number] Date of Birth: 1976-01-23 Referring Provider:  Abelina Bachelor, MD  Initial Encounter Date:    Visit Diagnosis: S/P CABG x 3  Patient's Home Medications on Admission:  Current outpatient prescriptions:  .  amiodarone (PACERONE) 200 MG tablet, Take 1 tablet by mouth daily., Disp: , Rfl:  .  aspirin EC 81 MG tablet, Take 81 mg by mouth daily., Disp: , Rfl:  .  atorvastatin (LIPITOR) 80 MG tablet, Take 1 tablet by mouth daily., Disp: , Rfl:  .  furosemide (LASIX) 20 MG tablet, Take 1 tablet by mouth daily., Disp: , Rfl:  .  gabapentin (NEURONTIN) 100 MG capsule, Take 1 capsule by mouth 3 (three) times daily., Disp: , Rfl:  .  metoprolol succinate (TOPROL-XL) 50 MG 24 hr tablet, Take 1 tablet by mouth daily., Disp: , Rfl:  .  oxyCODONE (OXY IR/ROXICODONE) 5 MG immediate release tablet, Take 1 tablet by mouth every 4 (four) hours as needed. , Disp: , Rfl:   Past Medical History: Past Medical History  Diagnosis Date  . Hypertension   . High cholesterol   . ST elevation (STEMI) myocardial infarction involving left anterior descending coronary artery [facility]) Jan 2016  . Tobacco use   . Coronary artery disease   . CHF (congestive heart failure) (HCC)     Tobacco Use: History  Smoking status  . Former Smoker -- 1.50 packs/day for 20 years  . Types: Cigarettes  . Quit date: 10/05/2015  Smokeless tobacco  . Not on file    Labs: Recent Review Flowsheet Data    There is no flowsheet data to display.       Exercise Target Goals:    Exercise Program Goal: Individual exercise prescription set with THRR, safety & activity barriers. Participant demonstrates ability to understand and report RPE using BORG scale, to self-measure pulse accurately, and to acknowledge the importance of the exercise prescription.  Exercise Prescription Goal: Starting with aerobic activity 30 plus minutes a  day, 3 days per week for initial exercise prescription. Provide home exercise prescription and guidelines that participant acknowledges understanding prior to discharge.  Activity Barriers & Risk Stratification:     Activity Barriers & Cardiac Risk Stratification - 12/20/15 1425    Activity Barriers & Cardiac Risk Stratification   Activity Barriers None   Cardiac Risk Stratification High      6 Minute Walk:     6 Minute Walk      12/20/15 1430       6 Minute Walk   Phase Initial     Distance 1520 feet     Walk Time 6 minutes     RPE 9     Resting HR 78 bpm     Resting BP 138/82 mmHg     Max Ex. HR 110 bpm     Max Ex. BP 148/84 mmHg        Initial Exercise Prescription:   Exercise Prescription Changes:   Discharge Exercise Prescription (Final Exercise Prescription Changes):   Nutrition:  Target Goals: Understanding of nutrition guidelines, daily intake of sodium 1500mg , cholesterol 200mg , calories 30% from fat and 7% or less from saturated fats, daily to have 5 or more servings of fruits and vegetables.  Biometrics:     Pre Biometrics - 12/20/15 1432    Pre Biometrics   Height 5' 5.6" (1.666 m)   Weight 195 lb 12.8 oz (88.814 kg)  Waist Circumference 41 inches   Hip Circumference 39.25 inches   Waist to Hip Ratio 1.04 %   BMI (Calculated) 32.1       Nutrition Therapy Plan and Nutrition Goals:     Nutrition Therapy & Goals - 12/20/15 1432    Nutrition Therapy   Drug/Food Interactions Statins/Certain Fruits   Intervention Plan   Intervention Prescribe, educate and counsel regarding individualized specific dietary modifications aiming towards targeted core components such as weight, hypertension, lipid management, diabetes, heart failure and other comorbidities.   Expected Outcomes Short Term Goal: Understand basic principles of dietary content, such as calories, fat, sodium, cholesterol and nutrients.;Long Term Goal: Adherence to prescribed nutrition  plan.      Nutrition Discharge: Rate Your Plate Scores:   Nutrition Goals Re-Evaluation:   Psychosocial: Target Goals: Acknowledge presence or absence of depression, maximize coping skills, provide positive support system. Participant is able to verbalize types and ability to use techniques and skills needed for reducing stress and depression.  Initial Review & Psychosocial Screening:     Initial Psych Review & Screening - 12/20/15 1441    Family Dynamics   Comments Gregg [name] reports he has no desire to quit smoking even though his wife still smokes. He said he felt like he couldn't breath when he woke up from his CABG plus his Dad died from COPD. Gregg [name] reports his father had no quality of life his last couple of years before he died since he was at home with oxygen.       Quality of Life Scores:     Quality of Life - 12/20/15 1439    Quality of Life Scores   Health/Function Pre 24.7 %   Socioeconomic Pre 24.14 %   Psych/Spiritual Pre 27.93 %   Family Pre 26.4 %   GLOBAL Pre 25.5 %      PHQ-9:     Recent Review Flowsheet Data    Depression screen [facility] 2/9 12/20/2015   Decreased Interest 0   Down, Depressed, Hopeless 0   PHQ - 2 Score 0   Altered sleeping 0   Tired, decreased energy 1   Change in appetite 2   Feeling bad or failure about yourself  0   Trouble concentrating 0   Moving slowly or fidgety/restless 0   Suicidal thoughts 0   PHQ-9 Score 3   Difficult doing work/chores Not difficult at all      Psychosocial Evaluation and Intervention:   Psychosocial Re-Evaluation:   Vocational Rehabilitation: Provide vocational rehab assistance to qualifying candidates.   Vocational Rehab Evaluation & Intervention:     Vocational Rehab - 12/20/15 1426    Initial Vocational Rehab Evaluation & Intervention   Assessment shows need for Vocational Rehabilitation No      Education: Education Goals: Education classes will be provided on a weekly basis, covering  required topics. Participant will state understanding/return demonstration of topics presented.  Learning Barriers/Preferences:     Learning Barriers/Preferences - 12/20/15 1425    Learning Barriers/Preferences   Learning Barriers None   Learning Preferences None      Education Topics: General Nutrition Guidelines/Fats and Fiber: -Group instruction provided by verbal, written material, models and posters to present the general guidelines for heart healthy nutrition. Gives an explanation and review of dietary fats and fiber.   Controlling Sodium/Reading Food Labels: -Group verbal and written material supporting the discussion of sodium use in heart healthy nutrition. Review and explanation with models, verbal and written materials for utilization  of the food label.   Exercise Physiology & Risk Factors: - Group verbal and written instruction with models to review the exercise physiology of the cardiovascular system and associated critical values. Details cardiovascular disease risk factors and the goals associated with each risk factor.   Aerobic Exercise & Resistance Training: - Gives group verbal and written discussion on the health impact of inactivity. On the components of aerobic and resistive training programs and the benefits of this training and how to safely progress through these programs.   Flexibility, Balance, General Exercise Guidelines: - Provides group verbal and written instruction on the benefits of flexibility and balance training programs. Provides general exercise guidelines with specific guidelines to those with heart or lung disease. Demonstration and skill practice provided.   Stress Management: - Provides group verbal and written instruction about the health risks of elevated stress, cause of high stress, and healthy ways to reduce stress.   Depression: - Provides group verbal and written instruction on the correlation between heart/lung disease and  depressed mood, treatment options, and the stigmas associated with seeking treatment.   Anatomy & Physiology of the Heart: - Group verbal and written instruction and models provide basic cardiac anatomy and physiology, with the coronary electrical and arterial systems. Review of: AMI, Angina, Valve disease, Heart Failure, Cardiac Arrhythmia, Pacemakers, and the ICD.   Cardiac Procedures: - Group verbal and written instruction and models to describe the testing methods done to diagnose heart disease. Reviews the outcomes of the test results. Describes the treatment choices: Medical Management, Angioplasty, or Coronary Bypass Surgery.   Cardiac Medications: - Group verbal and written instruction to review commonly prescribed medications for heart disease. Reviews the medication, class of the drug, and side effects. Includes the steps to properly store meds and maintain the prescription regimen.   Go Sex-Intimacy & Heart Disease, Get SMART - Goal Setting: - Group verbal and written instruction through game format to discuss heart disease and the return to sexual intimacy. Provides group verbal and written material to discuss and apply goal setting through the application of the S.M.A.R.T. Method.   Other Matters of the Heart: - Provides group verbal, written materials and models to describe Heart Failure, Angina, Valve Disease, and Diabetes in the realm of heart disease. Includes description of the disease process and treatment options available to the cardiac patient.   Exercise & Equipment Safety: - Individual verbal instruction and demonstration of equipment use and safety with use of the equipment.          Cardiac Rehab from 12/20/2015 in [facility]   Date  12/20/15   Educator  C. EnterkinRN   Instruction Review Code  1- partially meets, needs review/practice      Infection Prevention: - Provides verbal and written material to individual with discussion of  infection control including proper hand washing and proper equipment cleaning during exercise session.      Cardiac Rehab from 12/20/2015 in [facility] Re[facility]   Date  12/20/15   Educator  C. EnterkinRN   Instruction Review Code  1- partially meets, needs review/practice      Falls Prevention: - Provides verbal and written material to individual with discussion of falls prevention and safety.      Cardiac Rehab from 12/20/2015 in [facility] Cardiac [facility] Pulmonary Rehab   Date  12/20/15   Educator  C. EnterkinRN   Instruction Review Code  2- meets goals/outcomes      Diabetes: -  Individual verbal and written instruction to review signs/symptoms of diabetes, desired ranges of glucose level fasting, after meals and with exercise. Advice that pre and post exercise glucose checks will be done for 3 sessions at entry of program.    Knowledge Questionnaire Score:     Knowledge Questionnaire Score - 12/20/15 1425    Knowledge Questionnaire Score   Pre Score 25      Personal Goals and Risk Factors at Admission:     Personal Goals and Risk Factors at Admission - 12/20/15 1433    Core Components/Risk Factors/Patient Goals on Admission   Tobacco Cessation Yes   Expected Outcomes Long Term: Complete abstinence from all tobacco products for at least 12 months from quit date.  Short term-cont to abstain for smoking-Quit Oct 05, 2015.   Hypertension Yes   Intervention Provide education on lifestyle modifcations including regular physical activity/exercise, weight management, moderate sodium restriction and increased consumption of fresh fruit, vegetables, and low fat dairy, alcohol moderation, and smoking cessation.;Monitor prescription use compliance.   Expected Outcomes Short Term: Continued assessment and intervention until BP is < 140/49mm HG in hypertensive participants. < 130/40mm HG in hypertensive participants with diabetes, heart failure or chronic kidney disease.;Long Term:  Maintenance of blood pressure at goal levels.   Lipids Yes   Intervention Provide education and support for participant on nutrition & aerobic/resistive exercise along with prescribed medications to achieve LDL 70mg , HDL >40mg .   Expected Outcomes Short Term: Participant states understanding of desired cholesterol values and is compliant with medications prescribed. Participant is following exercise prescription and nutrition guidelines.;Long Term: Cholesterol controlled with medications as prescribed, with individualized exercise RX and with personalized nutrition plan. Value goals: LDL < 70mg , HDL > 40 mg.      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 No money coming in when he had his CABG at [facility] 2 days before Christmas. His wife is a stay at home Mom for their 74 year old. Jorja Loa drives his 2 children to school opposite side of town so he may possibly be a little late for Cardiac Rehab. Will alternate the 8am and 8:30am class.  Gregg [name] said he has no desire to return to smoking since he felt like he couldn't breath when he woke up from surgery with the breathing tube in. Gregg [name] reports his FAther died of COPD.          Comments:

## 2015-12-20 NOTE — Progress Notes (Signed)
Daily Session Note  Patient Details  Name: Gregg [name] MRN: [id number] Date of Birth: [birth year] Referring Provider:  Joaquim Nam, MD  Encounter Date: 12/20/2015  Check In:     Session Check In - 12/20/15 1426    Check-In   Location [facility]-Cardiac & Pulmonary Rehab   Staff Present Gerlene Burdock, RN, BSN;Steven Way, BS, ACSM EP-C, Exercise Physiologist   Supervising physician immediately available to respond to emergencies See telemetry face sheet for immediately available ER MD   Medication changes reported     No   Fall or balance concerns reported    No   Warm-up and Cool-down Not performed (comment)  6  min walk test done   Resistance Training Performed No   VAD Patient? No   Pain Assessment   Currently in Pain? No/denies         Goals Met:  Personal goals reviewed  Goals Unmet:  Not Applicable  Comments: 6 min walk test done in the hallway while on the Cardiac Rehab telemetry monitor.    Dr. Emily Filbert is Medical Director for [facility] and [facility].

## 2015-12-24 DIAGNOSIS — Z951 Presence of aortocoronary bypass graft: Secondary | ICD-10-CM

## 2015-12-24 NOTE — Progress Notes (Signed)
Daily Session Note  Patient Details  Name: Gregg [name] MRN: [id number] Date of Birth: 05/24/1976 Referring Provider:  Newby, Laura Kristin, MD  Encounter Date: 12/24/2015  Check In:     Session Check In - 12/24/15 0950    Check-In   Location [facility]-Cardiac & Pulmonary Rehab   Staff Present Susanne Bice, RN, BSN, CCRP;Rebecca Sickles, PT;[name], BS, ACSM CEP, Exercise Physiologist   Supervising physician immediately available to respond to emergencies See telemetry face sheet for immediately available ER MD   Medication changes reported     No   Fall or balance concerns reported    No   Warm-up and Cool-down Performed on first and last piece of equipment   Resistance Training Performed Yes   VAD Patient? No   Pain Assessment   Currently in Pain? No/denies         Goals Met:  Exercise tolerated well No report of cardiac concerns or symptoms  Goals Unmet:  Not Applicable  Comments: First day of exercise! Patient was oriented to the gym and the equipment functions and settings. Procedures and policies of the gym were outlined and explained. The patient's individual exercise prescription and treatment plan were reviewed with them. All starting workloads were established based on the results of the functional testing  done at the initial intake visit. The plan for exercise progression was also introduced and progression will be customized based on the patient's performance and goals.    Dr. Mark Miller is Medical Director for [facility] and [facility]. 

## 2015-12-25 ENCOUNTER — Ambulatory Visit: Payer: BLUE CROSS/BLUE SHIELD

## 2015-12-27 ENCOUNTER — Encounter: Payer: Self-pay | Admitting: Dietician

## 2016-01-01 ENCOUNTER — Ambulatory Visit: Payer: BLUE CROSS/BLUE SHIELD

## 2016-01-08 ENCOUNTER — Ambulatory Visit: Payer: BLUE CROSS/BLUE SHIELD

## 2016-01-10 ENCOUNTER — Encounter: Payer: Self-pay | Admitting: *Deleted

## 2016-01-10 ENCOUNTER — Telehealth: Payer: Self-pay | Admitting: *Deleted

## 2016-01-10 DIAGNOSIS — Z951 Presence of aortocoronary bypass graft: Secondary | ICD-10-CM

## 2016-01-10 NOTE — Telephone Encounter (Signed)
Called to check on status to return to program. Work and schedule has made him unable to attend.  Should be able to return on Monday and Tuesday starting next week.

## 2016-01-10 NOTE — Progress Notes (Signed)
Cardiac Individual Treatment Plan  Patient Details  Name: ONDRE SALVETTI MRN: [id number] Date of Birth: 10-12-1976 Referring Provider:  Joaquim Nam, MD  Initial Encounter Date:       Cardiac Rehab from 12/20/2015 in [facility]   Date  12/20/15      Visit Diagnosis: S/P CABG x 3  Patient's Home Medications on Admission:  Current outpatient prescriptions:  .  amiodarone (PACERONE) 200 MG tablet, Take 1 tablet by mouth daily., Disp: , Rfl:  .  aspirin EC 81 MG tablet, Take 81 mg by mouth daily., Disp: , Rfl:  .  atorvastatin (LIPITOR) 80 MG tablet, Take 1 tablet by mouth daily., Disp: , Rfl:  .  furosemide (LASIX) 20 MG tablet, Take 1 tablet by mouth daily., Disp: , Rfl:  .  gabapentin (NEURONTIN) 100 MG capsule, Take 1 capsule by mouth 3 (three) times daily., Disp: , Rfl:  .  lisinopril (PRINIVIL,ZESTRIL) 5 MG tablet, Take by mouth., Disp: , Rfl:  .  metoprolol succinate (TOPROL-XL) 50 MG 24 hr tablet, Take 1 tablet by mouth daily., Disp: , Rfl:  .  oxyCODONE (OXY IR/ROXICODONE) 5 MG immediate release tablet, Take 1 tablet by mouth every 4 (four) hours as needed. , Disp: , Rfl:   Past Medical History: Past Medical History  Diagnosis Date  . Hypertension   . High cholesterol   . ST elevation (STEMI) myocardial infarction involving left anterior descending coronary artery [facility]) Jan 2016  . Tobacco use   . Coronary artery disease   . CHF (congestive heart failure) (HCC)     Tobacco Use: History  Smoking status  . Former Smoker -- 1.50 packs/day for 20 years  . Types: Cigarettes  . Quit date: 10/05/2015  Smokeless tobacco  . Not on file    Labs: Recent Review Flowsheet Data    There is no flowsheet data to display.       Exercise Target Goals:    Exercise Program Goal: Individual exercise prescription set with THRR, safety & activity barriers. Participant demonstrates ability to understand and report RPE using BORG scale, to  self-measure pulse accurately, and to acknowledge the importance of the exercise prescription.  Exercise Prescription Goal: Starting with aerobic activity 30 plus minutes a day, 3 days per week for initial exercise prescription. Provide home exercise prescription and guidelines that participant acknowledges understanding prior to discharge.  Activity Barriers & Risk Stratification:     Activity Barriers & Cardiac Risk Stratification - 12/20/15 1425    Activity Barriers & Cardiac Risk Stratification   Activity Barriers None   Cardiac Risk Stratification High      6 Minute Walk:     6 Minute Walk      12/20/15 1430       6 Minute Walk   Phase Initial     Distance 1520 feet     Walk Time 6 minutes     RPE 9     Resting HR 78 bpm     Resting BP 138/82 mmHg     Max Ex. HR 110 bpm     Max Ex. BP 148/84 mmHg        Initial Exercise Prescription:     Initial Exercise Prescription - 12/20/15 1400    Date of Initial Exercise Prescription   Date 12/20/15   Treadmill   MPH 3   Grade 0   Minutes 10   Recumbant Bike   Level 2   RPM 40  Watts 20   Minutes 10   NuStep   Level 2   Watts 50   Minutes 10   Arm Ergometer   Level 1   Watts 10   Minutes 10   Recumbant Elliptical   Level 3   RPM 45   Watts 25   Minutes 10   Elliptical   Level 1   Speed 3   Minutes 1   REL-XR   Level 3   Watts 60   Minutes 10   T5 Nustep   Level 2   Watts 25   Minutes 10   Biostep-RELP   Level 3   Watts 60   Minutes 10   Prescription Details   Frequency (times per week) 3   Duration Progress to 30 minutes of continuous aerobic without signs/symptoms of physical distress   Intensity   THRR REST +  30   Ratings of Perceived Exertion 11-15   Perceived Dyspnea 2-4   Progression   Progression Continue progressive overload as per policy without signs/symptoms or physical distress.   Resistance Training   Training Prescription Yes   Weight 2   Reps 10-15      Perform  Capillary Blood Glucose checks as needed.  Exercise Prescription Changes:     Exercise Prescription Changes      12/24/15 1107           Exercise Review   Progression No  Only one visit this month, 12/24/15       Response to Exercise   Blood Pressure (Admit) 128/70 mmHg       Blood Pressure (Exercise) 162/82 mmHg       Blood Pressure (Exit) 112/72 mmHg       Heart Rate (Admit) 115 bpm       Heart Rate (Exercise) 136 bpm       Heart Rate (Exit) 100 bpm       Rating of Perceived Exertion (Exercise) 7       Symptoms None       Comments First day of exercise! Patient was oriented to the gym and the equipment functions and settings. Procedures and policies of the gym were outlined and explained. The patient's individual exercise prescription and treatment plan were reviewed with them. All starting workloads were established based on the results of the functional testing  done at the initial intake visit. The plan for exercise progression was also introduced and progression will be customized based on the patient's performance and goals.        Duration Progress to 30 minutes of continuous aerobic without signs/symptoms of physical distress       Intensity Rest + 30       Resistance Training   Training Prescription Yes       Weight 2       Reps 10-15       Treadmill   MPH 3       Grade 0       Minutes 10       Recumbant Bike   Level 2       RPM 40       Watts 20       Minutes 10       NuStep   Level 2       Watts 50       Minutes 10       Arm Ergometer   Level 1       Watts 10  Minutes 10       Recumbant Elliptical   Level 3       RPM 45       Watts 25       Minutes 10       Elliptical   Level 1       Speed 3       Minutes 1       REL-XR   Level 3       Watts 60       Minutes 10       T5 Nustep   Level 2       Watts 25       Minutes 10       Biostep-RELP   Level 3       Watts 60       Minutes 10          Exercise Comments:   Discharge Exercise  Prescription (Final Exercise Prescription Changes):     Exercise Prescription Changes - 12/24/15 1107    Exercise Review   Progression No  Only one visit this month, 12/24/15   Response to Exercise   Blood Pressure (Admit) 128/70 mmHg   Blood Pressure (Exercise) 162/82 mmHg   Blood Pressure (Exit) 112/72 mmHg   Heart Rate (Admit) 115 bpm   Heart Rate (Exercise) 136 bpm   Heart Rate (Exit) 100 bpm   Rating of Perceived Exertion (Exercise) 7   Symptoms None   Comments First day of exercise! Patient was oriented to the gym and the equipment functions and settings. Procedures and policies of the gym were outlined and explained. The patient's individual exercise prescription and treatment plan were reviewed with them. All starting workloads were established based on the results of the functional testing  done at the initial intake visit. The plan for exercise progression was also introduced and progression will be customized based on the patient's performance and goals.    Duration Progress to 30 minutes of continuous aerobic without signs/symptoms of physical distress   Intensity Rest + 30   Resistance Training   Training Prescription Yes   Weight 2   Reps 10-15   Treadmill   MPH 3   Grade 0   Minutes 10   Recumbant Bike   Level 2   RPM 40   Watts 20   Minutes 10   NuStep   Level 2   Watts 50   Minutes 10   Arm Ergometer   Level 1   Watts 10   Minutes 10   Recumbant Elliptical   Level 3   RPM 45   Watts 25   Minutes 10   Elliptical   Level 1   Speed 3   Minutes 1   REL-XR   Level 3   Watts 60   Minutes 10   T5 Nustep   Level 2   Watts 25   Minutes 10   Biostep-RELP   Level 3   Watts 60   Minutes 10      Nutrition:  Target Goals: Understanding of nutrition guidelines, daily intake of sodium '1500mg'$ , cholesterol '200mg'$ , calories 30% from fat and 7% or less from saturated fats, daily to have 5 or more servings of fruits and vegetables.  Biometrics:     Pre  Biometrics - 12/20/15 1432    Pre Biometrics   Height 5' 5.6" (1.666 m)   Weight 195 lb 12.8 oz (88.814 kg)   Waist Circumference 41  inches   Hip Circumference 39.25 inches   Waist to Hip Ratio 1.04 %   BMI (Calculated) 32.1       Nutrition Therapy Plan and Nutrition Goals:     Nutrition Therapy & Goals - 12/27/15 1803    Personal Nutrition Goals   Comments Patient declines RD meeting      Nutrition Discharge: Rate Your Plate Scores:     Nutrition Assessments - 12/27/15 1803    Rate Your Plate Scores   Pre Score 69   Pre Score % 77 %      Nutrition Goals Re-Evaluation:   Psychosocial: Target Goals: Acknowledge presence or absence of depression, maximize coping skills, provide positive support system. Participant is able to verbalize types and ability to use techniques and skills needed for reducing stress and depression.  Initial Review & Psychosocial Screening:     Initial Psych Review & Screening - 12/20/15 1441    Family Dynamics   Comments [name] reports he has no desire to quit smoking even though his wife still smokes. He said he felt like he couldn't breath when he woke up from his CABG plus his Dad died from COPD. [name] reports his father had no quality of life his last couple of years before he died since he was at home with oxygen.       Quality of Life Scores:     Quality of Life - 12/20/15 1439    Quality of Life Scores   Health/Function Pre 24.7 %   Socioeconomic Pre 24.14 %   Psych/Spiritual Pre 27.93 %   Family Pre 26.4 %   GLOBAL Pre 25.5 %      PHQ-9:     Recent Review Flowsheet Data    Depression screen [facility] 2/9 12/20/2015   Decreased Interest 0   Down, Depressed, Hopeless 0   PHQ - 2 Score 0   Altered sleeping 0   Tired, decreased energy 1   Change in appetite 2   Feeling bad or failure about yourself  0   Trouble concentrating 0   Moving slowly or fidgety/restless 0   Suicidal thoughts 0   PHQ-9 Score 3   Difficult doing  work/chores Not difficult at all      Psychosocial Evaluation and Intervention:     Psychosocial Evaluation - 12/24/15 1008    Psychosocial Evaluation & Interventions   Interventions Relaxation education;Stress management education;Encouraged to exercise with the program and follow exercise prescription   Comments Counselor met with Mr. Seamans today for initial psychosocial evaluation.  He is a 40 year old who had triple bypass surgery several months ago.  He has a strong support system with a spouse of 22 years and other close family who live close by.  He reports that he sleeps well and his appetite varies since the surgery.  He denies a history of depression or anxiety or any current symptoms.  He states his mood is typically positive although he is unable to resume his normal work duties.  Mr. Longmore has goals to lose weight and get back his strength and normal life while in this program.  Counselor recommended he meet with the dietician to address his weight loss goals.        Psychosocial Re-Evaluation:   Vocational Rehabilitation: Provide vocational rehab assistance to qualifying candidates.   Vocational Rehab Evaluation & Intervention:     Vocational Rehab - 12/20/15 1426    Initial Vocational Rehab Evaluation & Intervention   Assessment shows  need for Vocational Rehabilitation No      Education: Education Goals: Education classes will be provided on a weekly basis, covering required topics. Participant will state understanding/return demonstration of topics presented.  Learning Barriers/Preferences:     Learning Barriers/Preferences - 12/20/15 1425    Learning Barriers/Preferences   Learning Barriers None   Learning Preferences None      Education Topics: General Nutrition Guidelines/Fats and Fiber: -Group instruction provided by verbal, written material, models and posters to present the general guidelines for heart healthy nutrition. Gives an explanation and  review of dietary fats and fiber.   Controlling Sodium/Reading Food Labels: -Group verbal and written material supporting the discussion of sodium use in heart healthy nutrition. Review and explanation with models, verbal and written materials for utilization of the food label.   Exercise Physiology & Risk Factors: - Group verbal and written instruction with models to review the exercise physiology of the cardiovascular system and associated critical values. Details cardiovascular disease risk factors and the goals associated with each risk factor.   Aerobic Exercise & Resistance Training: - Gives group verbal and written discussion on the health impact of inactivity. On the components of aerobic and resistive training programs and the benefits of this training and how to safely progress through these programs.   Flexibility, Balance, General Exercise Guidelines: - Provides group verbal and written instruction on the benefits of flexibility and balance training programs. Provides general exercise guidelines with specific guidelines to those with heart or lung disease. Demonstration and skill practice provided.   Stress Management: - Provides group verbal and written instruction about the health risks of elevated stress, cause of high stress, and healthy ways to reduce stress.   Depression: - Provides group verbal and written instruction on the correlation between heart/lung disease and depressed mood, treatment options, and the stigmas associated with seeking treatment.   Anatomy & Physiology of the Heart: - Group verbal and written instruction and models provide basic cardiac anatomy and physiology, with the coronary electrical and arterial systems. Review of: AMI, Angina, Valve disease, Heart Failure, Cardiac Arrhythmia, Pacemakers, and the ICD.   Cardiac Procedures: - Group verbal and written instruction and models to describe the testing methods done to diagnose heart disease.  Reviews the outcomes of the test results. Describes the treatment choices: Medical Management, Angioplasty, or Coronary Bypass Surgery.   Cardiac Medications: - Group verbal and written instruction to review commonly prescribed medications for heart disease. Reviews the medication, class of the drug, and side effects. Includes the steps to properly store meds and maintain the prescription regimen.   Go Sex-Intimacy & Heart Disease, Get SMART - Goal Setting: - Group verbal and written instruction through game format to discuss heart disease and the return to sexual intimacy. Provides group verbal and written material to discuss and apply goal setting through the application of the S.M.A.R.T. Method.   Other Matters of the Heart: - Provides group verbal, written materials and models to describe Heart Failure, Angina, Valve Disease, and Diabetes in the realm of heart disease. Includes description of the disease process and treatment options available to the cardiac patient.   Exercise & Equipment Safety: - Individual verbal instruction and demonstration of equipment use and safety with use of the equipment.          Cardiac Rehab from 12/20/2015 in [facility] Cardiac and Pulmonary Rehab   Date  12/20/15   Educator  C. [place]   Instruction Review Code  1- partially meets, needs  review/practice      Infection Prevention: - Provides verbal and written material to individual with discussion of infection control including proper hand washing and proper equipment cleaning during exercise session.      Cardiac Rehab from 12/20/2015 in [facility] Cardiac and Pulmonary Rehab   Date  12/20/15   Educator  C. [place]   Instruction Review Code  1- partially meets, needs review/practice      Falls Prevention: - Provides verbal and written material to individual with discussion of falls prevention and safety.      Cardiac Rehab from 12/20/2015 in [facility] Cardiac and Pulmonary Rehab   Date  12/20/15   Educator   C. [place]   Instruction Review Code  2- meets goals/outcomes      Diabetes: - Individual verbal and written instruction to review signs/symptoms of diabetes, desired ranges of glucose level fasting, after meals and with exercise. Advice that pre and post exercise glucose checks will be done for 3 sessions at entry of program.    Knowledge Questionnaire Score:     Knowledge Questionnaire Score - 12/20/15 1425    Knowledge Questionnaire Score   Pre Score 25      Personal Goals and Risk Factors at Admission:     Personal Goals and Risk Factors at Admission - 12/20/15 1433    Core Components/Risk Factors/Patient Goals on Admission   Tobacco Cessation Yes   Expected Outcomes Long Term: Complete abstinence from all tobacco products for at least 12 months from quit date.  Short term-cont to abstain for smoking-Quit Oct 05, 2015.   Hypertension Yes   Intervention Provide education on lifestyle modifcations including regular physical activity/exercise, weight management, moderate sodium restriction and increased consumption of fresh fruit, vegetables, and low fat dairy, alcohol moderation, and smoking cessation.;Monitor prescription use compliance.   Expected Outcomes Short Term: Continued assessment and intervention until BP is < 140/98m HG in hypertensive participants. < 130/877mHG in hypertensive participants with diabetes, heart failure or chronic kidney disease.;Long Term: Maintenance of blood pressure at goal levels.   Lipids Yes   Intervention Provide education and support for participant on nutrition & aerobic/resistive exercise along with prescribed medications to achieve LDL '70mg'$ , HDL >'40mg'$ .   Expected Outcomes Short Term: Participant states understanding of desired cholesterol values and is compliant with medications prescribed. Participant is following exercise prescription and nutrition guidelines.;Long Term: Cholesterol controlled with medications as prescribed, with  individualized exercise RX and with personalized nutrition plan. Value goals: LDL < '70mg'$ , HDL > 40 mg.      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 No money coming in [facility]. His wife is a stay at home Mom for their 3 23ear old. TiOctavia Brucknerrives his 2 children to school opposite side of town so he may possibly be a little late for Cardiac Rehab. Will alternate the 8am and 8:30am class.  [name] said he has no desire to return to smoking since he felt like he couldn't breath when he woke up from surgery with the breathing tube in. [name] reports his FAther died  of COPD.  30 Day Review. Continue with the ITP. Sporadic attendance because of work schedule. PLans to return next week.        Comments:

## 2016-01-15 ENCOUNTER — Ambulatory Visit: Payer: BLUE CROSS/BLUE SHIELD

## 2016-01-21 ENCOUNTER — Encounter: Payer: BLUE CROSS/BLUE SHIELD | Attending: Cardiology

## 2016-01-21 DIAGNOSIS — Z951 Presence of aortocoronary bypass graft: Secondary | ICD-10-CM | POA: Insufficient documentation

## 2016-01-22 ENCOUNTER — Ambulatory Visit: Payer: BLUE CROSS/BLUE SHIELD

## 2016-01-29 ENCOUNTER — Ambulatory Visit: Payer: BLUE CROSS/BLUE SHIELD

## 2016-01-30 IMAGING — US US EXTREM  UP VENOUS BILAT
1 series · 12 of 24 positions shown · non-contrast
Comparison: None.

CLINICAL DATA: Upper extremity edema and erythema.

EXAM:
BILATERAL UPPER EXTREMITY VENOUS DUPLEX ULTRASOUND
TECHNIQUE: Gray-scale sonography with graded compression, as well as color
Doppler and duplex ultrasound were performed to evaluate the
bilateral upper extremity deep venous systems from the level of the
subclavian vein and including the jugular, axillary, basilic,
radial, ulnar and upper cephalic vein. Spectral Doppler was utilized
to evaluate flow at rest and with distal augmentation maneuvers.

[Series 1: us extrem up venous bilat · 0.05mm/px · 12 of 71 slices shown]
[im 4/71]
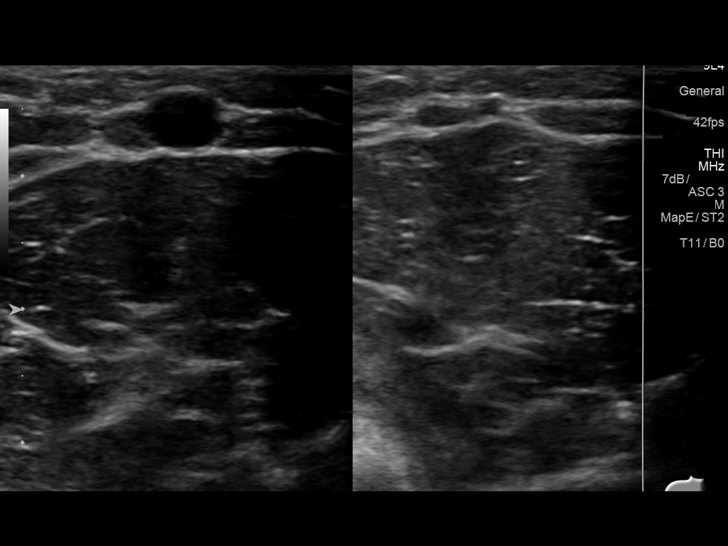
[im 10/71]
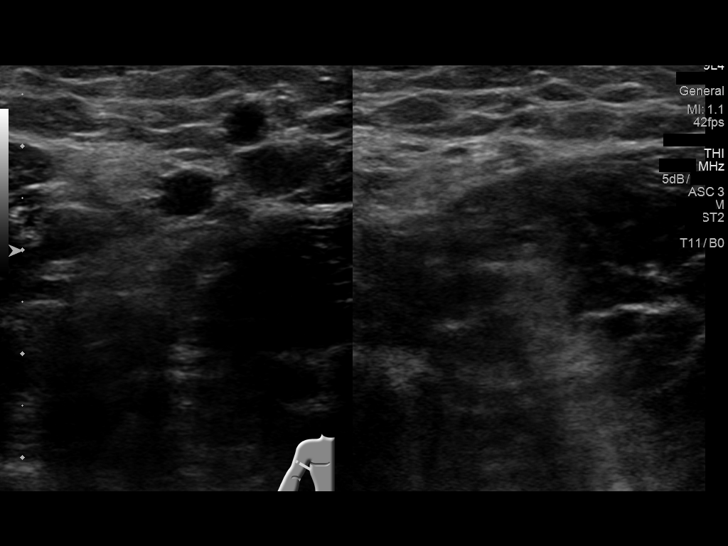
[im 16/71]
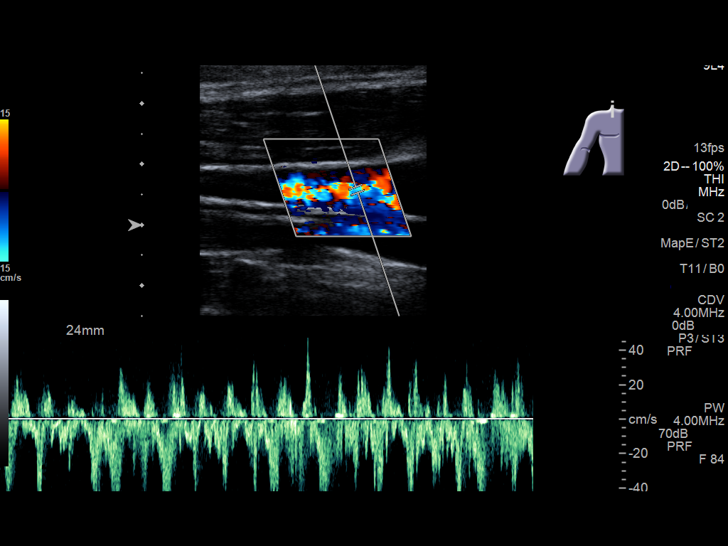
[im 22/71]
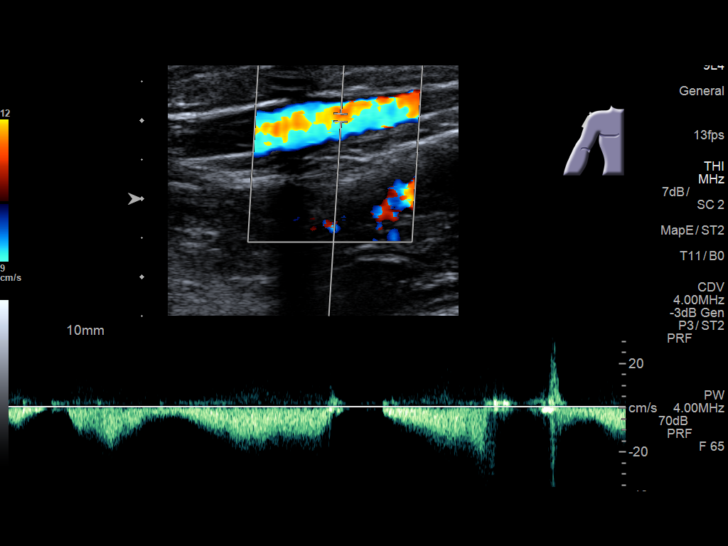
[im 28/71]
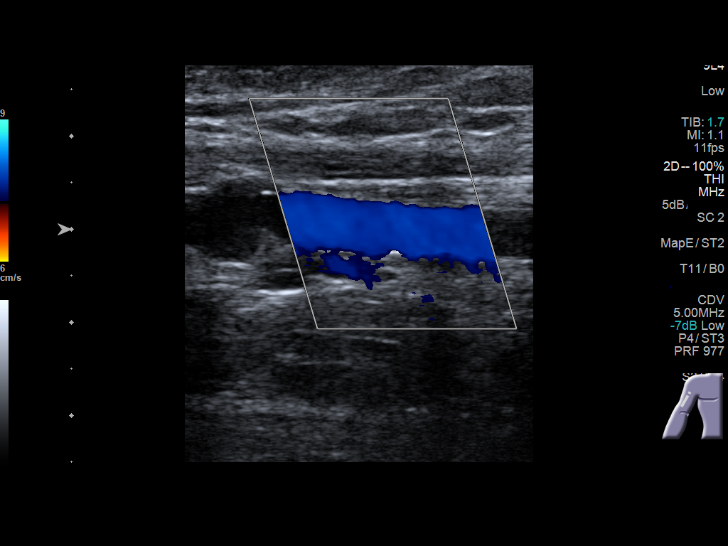
[im 34/71]
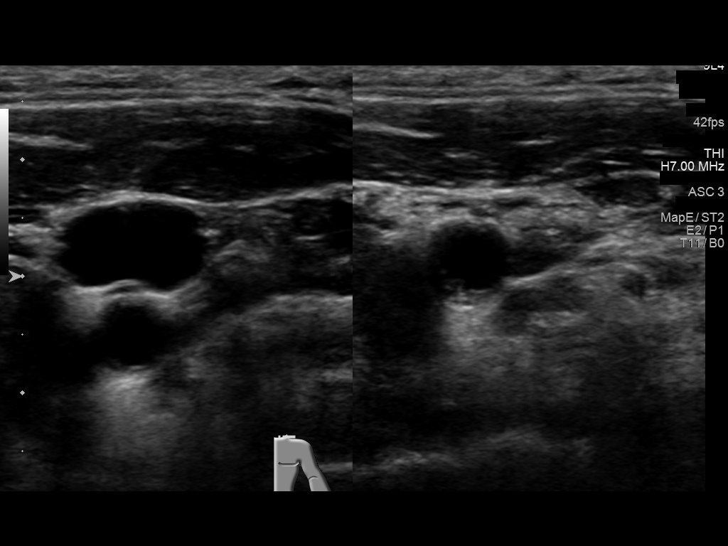
[im 40/71]
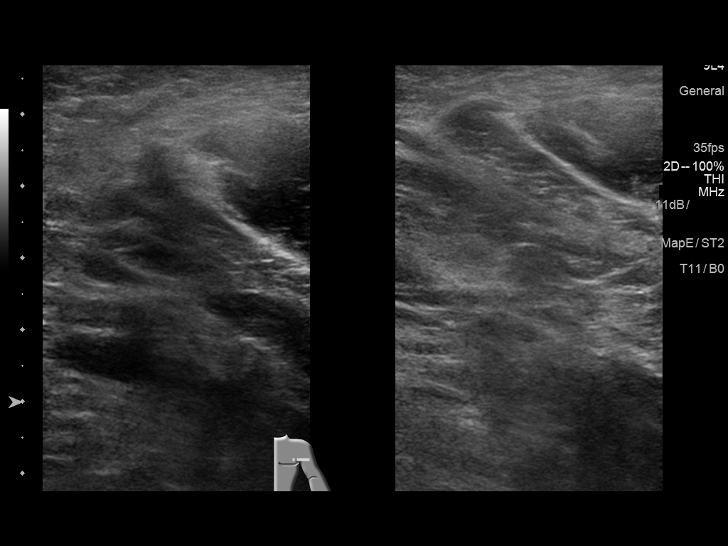
[im 46/71]
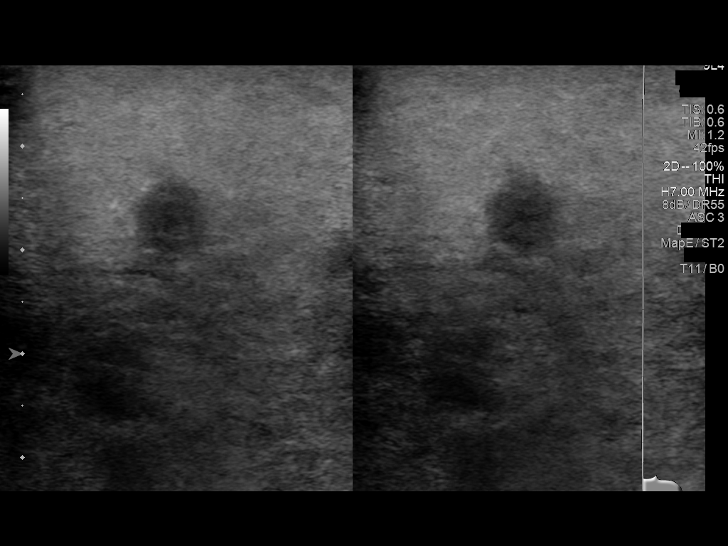
[im 52/71]
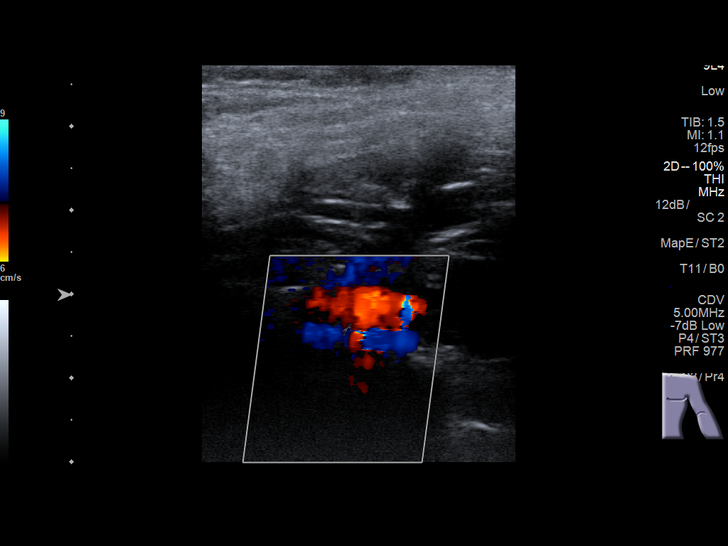
[im 58/71]
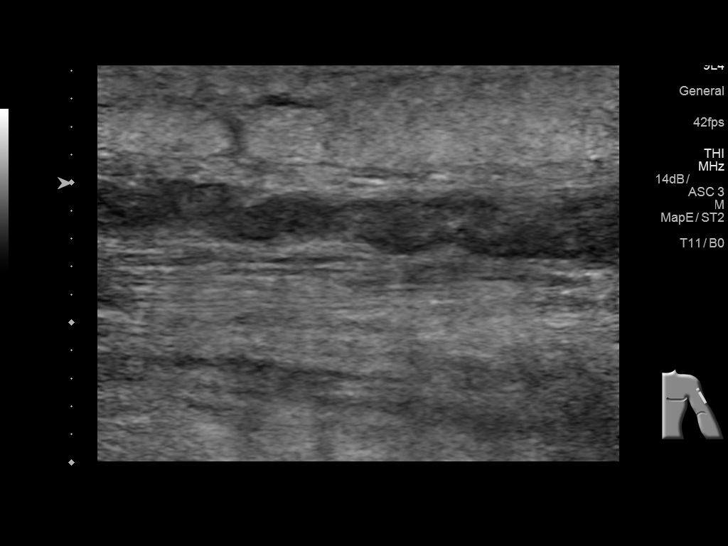
[im 64/71]
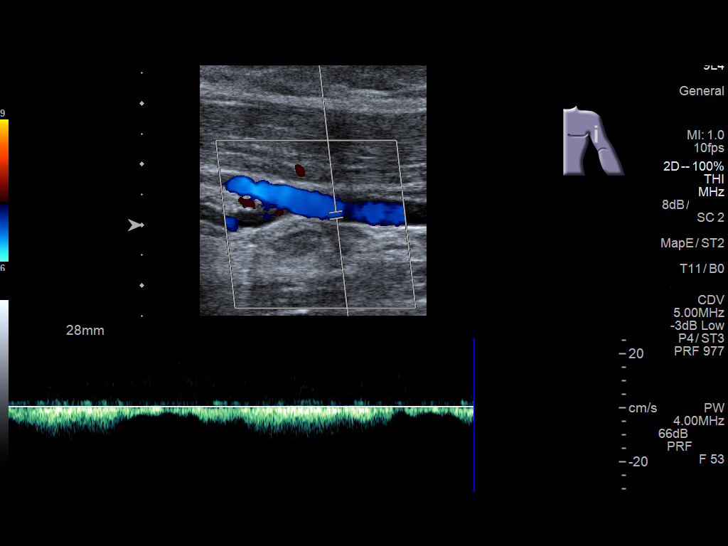
[im 71/71]
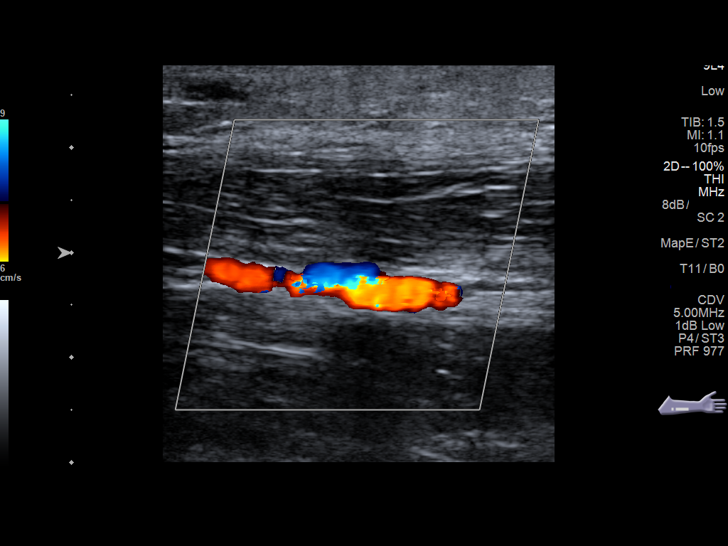

[12 of 24 positions shown; findings below may reference images not displayed]

FINDINGS: RIGHT UPPER EXTREMITY

Internal Jugular Vein: No evidence of thrombus. Normal
compressibility, respiratory phasicity and response to augmentation.

Subclavian Vein: No evidence of thrombus. Normal compressibility,
respiratory phasicity and response to augmentation.

Axillary Vein: No evidence of thrombus. Normal compressibility,
respiratory phasicity and response to augmentation.

Cephalic Vein: No evidence of thrombus. Normal compressibility,
respiratory phasicity and response to augmentation.

Basilic Vein: No evidence of thrombus. Normal compressibility,
respiratory phasicity and response to augmentation.

Brachial Veins: No evidence of thrombus. Normal compressibility,
respiratory phasicity and response to augmentation.

Radial Veins: No evidence of thrombus. Normal compressibility,
respiratory phasicity and response to augmentation.

Ulnar Veins: No evidence of thrombus. Normal compressibility,
respiratory phasicity and response to augmentation.

Venous Reflux:  None.

Other Findings:  None.

LEFT UPPER EXTREMITY

Internal Jugular Vein: No evidence of thrombus. Normal
compressibility, respiratory phasicity and response to augmentation.

Subclavian Vein: No evidence of thrombus. Normal compressibility,
respiratory phasicity and response to augmentation.

Axillary Vein: No evidence of thrombus. Normal compressibility,
respiratory phasicity and response to augmentation.

Cephalic Vein: There is acute thrombus throughout this vessel
without compression or augmentation. There is absence of venous
Doppler signal in this region.

Basilic Vein: There is acute thrombus throughout much of this
vessel. In areas for thrombus is seen, there is no compression or
augmentation. There is absence of venous Doppler signal.

Brachial Veins: There are 2 branches of the brachial vein on the
left. One of these branches contains acute appearing thrombus with
loss of augmentation and compression and loss of Doppler signal
consistent with acute occlusive deep venous thrombosis. The other
branch is patent.

Radial Veins: No evidence of thrombus. Normal compressibility,
respiratory phasicity and response to augmentation.

Ulnar Veins: No evidence of thrombus. Normal compressibility,
respiratory phasicity and response to augmentation.

Venous Reflux:  None.

Other Findings:  None.
IMPRESSION: No evidence of deep venous thrombosis on the right. On the left,
there is acute deep venous thrombosis in a branch of the left
brachial vein, a deep structure. There is acute thrombus in much of
the left cephalic and basilic veins, superficial venous structures.
The remainder the left upper extremity venous system appears
unremarkable.

## 2016-02-05 ENCOUNTER — Ambulatory Visit: Payer: BLUE CROSS/BLUE SHIELD

## 2016-02-10 ENCOUNTER — Encounter: Payer: Self-pay | Admitting: *Deleted

## 2016-02-10 DIAGNOSIS — Z951 Presence of aortocoronary bypass graft: Secondary | ICD-10-CM

## 2016-02-10 NOTE — Progress Notes (Signed)
Cardiac Individual Treatment Plan  Patient Details  Name: Gregg [name] MRN: [id number] Date of Birth: July 21, 1976 Referring Provider:    Initial Encounter Date:       Cardiac Rehab from 12/20/2015 in [facility] Cardiac and Pulmonary Rehab   Date  12/20/15      Visit Diagnosis: S/P CABG x 3  Patient's Home Medications on Admission:  Current outpatient prescriptions:  .  amiodarone (PACERONE) 200 MG tablet, Take 1 tablet by mouth daily., Disp: , Rfl:  .  aspirin EC 81 MG tablet, Take 81 mg by mouth daily., Disp: , Rfl:  .  atorvastatin (LIPITOR) 80 MG tablet, Take 1 tablet by mouth daily., Disp: , Rfl:  .  furosemide (LASIX) 20 MG tablet, Take 1 tablet by mouth daily., Disp: , Rfl:  .  gabapentin (NEURONTIN) 100 MG capsule, Take 1 capsule by mouth 3 (three) times daily., Disp: , Rfl:  .  lisinopril (PRINIVIL,ZESTRIL) 5 MG tablet, Take by mouth., Disp: , Rfl:  .  metoprolol succinate (TOPROL-XL) 50 MG 24 hr tablet, Take 1 tablet by mouth daily., Disp: , Rfl:  .  oxyCODONE (OXY IR/ROXICODONE) 5 MG immediate release tablet, Take 1 tablet by mouth every 4 (four) hours as needed. , Disp: , Rfl:   Past Medical History: Past Medical History  Diagnosis Date  . Hypertension   . High cholesterol   . ST elevation (STEMI) myocardial infarction involving left anterior descending coronary artery [facility]) Jan 2016  . Tobacco use   . Coronary artery disease   . CHF (congestive heart failure) (HCC)     Tobacco Use: History  Smoking status  . Former Smoker -- 1.50 packs/day for 20 years  . Types: Cigarettes  . Quit date: 10/05/2015  Smokeless tobacco  . Not on file    Labs: Recent Review Flowsheet Data    There is no flowsheet data to display.       Exercise Target Goals:    Exercise Program Goal: Individual exercise prescription set with THRR, safety & activity barriers. Participant demonstrates ability to understand and report RPE using BORG scale, to self-measure pulse accurately,  and to acknowledge the importance of the exercise prescription.  Exercise Prescription Goal: Starting with aerobic activity 30 plus minutes a day, 3 days per week for initial exercise prescription. Provide home exercise prescription and guidelines that participant acknowledges understanding prior to discharge.  Activity Barriers & Risk Stratification:     Activity Barriers & Cardiac Risk Stratification - 12/20/15 1425    Activity Barriers & Cardiac Risk Stratification   Activity Barriers None   Cardiac Risk Stratification High      6 Minute Walk:     6 Minute Walk      12/20/15 1430       6 Minute Walk   Phase Initial     Distance 1520 feet     Walk Time 6 minutes     RPE 9     Resting HR 78 bpm     Resting BP 138/82 mmHg     Max Ex. HR 110 bpm     Max Ex. BP 148/84 mmHg        Initial Exercise Prescription:     Initial Exercise Prescription - 12/20/15 1400    Date of Initial Exercise RX and Referring Provider   Date 12/20/15   Treadmill   MPH 3   Grade 0   Minutes 10   Recumbant Bike   Level 2   RPM 40  Watts 20   Minutes 10   NuStep   Level 2   Watts 50   Minutes 10   Arm Ergometer   Level 1   Watts 10   Minutes 10   Recumbant Elliptical   Level 3   RPM 45   Watts 25   Minutes 10   Elliptical   Level 1   Speed 3   Minutes 1   REL-XR   Level 3   Watts 60   Minutes 10   T5 Nustep   Level 2   Watts 25   Minutes 10   Biostep-RELP   Level 3   Watts 60   Minutes 10   Prescription Details   Frequency (times per week) 3   Duration Progress to 30 minutes of continuous aerobic without signs/symptoms of physical distress   Intensity   THRR REST +  30   Ratings of Perceived Exertion 11-15   Perceived Dyspnea 2-4   Progression   Progression Continue progressive overload as per policy without signs/symptoms or physical distress.   Resistance Training   Training Prescription Yes   Weight 2   Reps 10-15      Perform Capillary Blood  Glucose checks as needed.  Exercise Prescription Changes:     Exercise Prescription Changes      12/24/15 1107           Exercise Review   Progression No  Only one visit this month, 12/24/15       Response to Exercise   Blood Pressure (Admit) 128/70 mmHg       Blood Pressure (Exercise) 162/82 mmHg       Blood Pressure (Exit) 112/72 mmHg       Heart Rate (Admit) 115 bpm       Heart Rate (Exercise) 136 bpm       Heart Rate (Exit) 100 bpm       Rating of Perceived Exertion (Exercise) 7       Symptoms None       Comments First day of exercise! Patient was oriented to the gym and the equipment functions and settings. Procedures and policies of the gym were outlined and explained. The patient's individual exercise prescription and treatment plan were reviewed with them. All starting workloads were established based on the results of the functional testing  done at the initial intake visit. The plan for exercise progression was also introduced and progression will be customized based on the patient's performance and goals.        Duration Progress to 30 minutes of continuous aerobic without signs/symptoms of physical distress       Intensity Rest + 30       Resistance Training   Training Prescription Yes       Weight 2       Reps 10-15       Treadmill   MPH 3       Grade 0       Minutes 10       Recumbant Bike   Level 2       RPM 40       Watts 20       Minutes 10       NuStep   Level 2       Watts 50       Minutes 10       Arm Ergometer   Level 1       Watts 10  Minutes 10       Recumbant Elliptical   Level 3       RPM 45       Watts 25       Minutes 10       Elliptical   Level 1       Speed 3       Minutes 1       REL-XR   Level 3       Watts 60       Minutes 10       T5 Nustep   Level 2       Watts 25       Minutes 10       Biostep-RELP   Level 3       Watts 60       Minutes 10          Exercise Comments:     Exercise Comments      02/08/16  1136           Exercise Comments [name] attended one session of Heart Track on 12-24-15.  His work schedule has prevented him from returning.  Goal is to overcome barriers to attendance at [facility].          Discharge Exercise Prescription (Final Exercise Prescription Changes):     Exercise Prescription Changes - 12/24/15 1107    Exercise Review   Progression No  Only one visit this month, 12/24/15   Response to Exercise   Blood Pressure (Admit) 128/70 mmHg   Blood Pressure (Exercise) 162/82 mmHg   Blood Pressure (Exit) 112/72 mmHg   Heart Rate (Admit) 115 bpm   Heart Rate (Exercise) 136 bpm   Heart Rate (Exit) 100 bpm   Rating of Perceived Exertion (Exercise) 7   Symptoms None   Comments First day of exercise! Patient was oriented to the gym and the equipment functions and settings. Procedures and policies of the gym were outlined and explained. The patient's individual exercise prescription and treatment plan were reviewed with them. All starting workloads were established based on the results of the functional testing  done at the initial intake visit. The plan for exercise progression was also introduced and progression will be customized based on the patient's performance and goals.    Duration Progress to 30 minutes of continuous aerobic without signs/symptoms of physical distress   Intensity Rest + 30   Resistance Training   Training Prescription Yes   Weight 2   Reps 10-15   Treadmill   MPH 3   Grade 0   Minutes 10   Recumbant Bike   Level 2   RPM 40   Watts 20   Minutes 10   NuStep   Level 2   Watts 50   Minutes 10   Arm Ergometer   Level 1   Watts 10   Minutes 10   Recumbant Elliptical   Level 3   RPM 45   Watts 25   Minutes 10   Elliptical   Level 1   Speed 3   Minutes 1   REL-XR   Level 3   Watts 60   Minutes 10   T5 Nustep   Level 2   Watts 25   Minutes 10   Biostep-RELP   Level 3   Watts 60   Minutes 10      Nutrition:  Target Goals:  Understanding of nutrition guidelines, daily intake of sodium <1562m,  cholesterol <256m, calories 30% from fat and 7% or less from saturated fats, daily to have 5 or more servings of fruits and vegetables.  Biometrics:     Pre Biometrics - 12/20/15 1432    Pre Biometrics   Height 5' 5.6" (1.666 m)   Weight 195 lb 12.8 oz (88.814 kg)   Waist Circumference 41 inches   Hip Circumference 39.25 inches   Waist to Hip Ratio 1.04 %   BMI (Calculated) 32.1       Nutrition Therapy Plan and Nutrition Goals:     Nutrition Therapy & Goals - 12/27/15 1803    Personal Nutrition Goals   Comments Patient declines RD meeting      Nutrition Discharge: Rate Your Plate Scores:     Nutrition Assessments - 12/27/15 1803    Rate Your Plate Scores   Pre Score 69   Pre Score % 77 %      Nutrition Goals Re-Evaluation:   Psychosocial: Target Goals: Acknowledge presence or absence of depression, maximize coping skills, provide positive support system. Participant is able to verbalize types and ability to use techniques and skills needed for reducing stress and depression.  Initial Review & Psychosocial Screening:     Initial Psych Review & Screening - 12/20/15 1441    Family Dynamics   Comments [name] reports he has no desire to quit smoking even though his wife still smokes. He said he felt like he couldn't breath when he woke up from his CABG plus his Dad died from COPD. [name] reports his father had no quality of life his last couple of years before he died since he was at home with oxygen.       Quality of Life Scores:     Quality of Life - 12/20/15 1439    Quality of Life Scores   Health/Function Pre 24.7 %   Socioeconomic Pre 24.14 %   Psych/Spiritual Pre 27.93 %   Family Pre 26.4 %   GLOBAL Pre 25.5 %      PHQ-9:     Recent Review Flowsheet Data    [facility] 12/20/2015   Decreased Interest 0   Down, Depressed, Hopeless 0   PHQ - 2 Score 0   Altered sleeping  0   Tired, decreased energy 1   Change in appetite 2   Feeling bad or failure about yourself  0   Trouble concentrating 0   Moving slowly or fidgety/restless 0   Suicidal thoughts 0   PHQ-9 Score 3   Difficult doing work/chores Not difficult at all      Psychosocial Evaluation and Intervention:     Psychosocial Evaluation - 12/24/15 1008    Psychosocial Evaluation & Interventions   Interventions Relaxation education;Stress management education;Encouraged to exercise with the program and follow exercise prescription   Comments Counselor met with Mr. LCarranzatoday for initial psychosocial evaluation.  He is a 40year old who had triple bypass surgery several months ago.  He has a strong support system with a spouse of 22 years and other close family who live close by.  He reports that he sleeps well and his appetite varies since the surgery.  He denies a history of depression or anxiety or any current symptoms.  He states his mood is typically positive although he is unable to resume his normal work duties.  Mr. LAngererhas goals to lose weight and get back his strength and normal life while in this program.  Counselor  recommended he meet with the dietician to address his weight loss goals.        Psychosocial Re-Evaluation:   Vocational Rehabilitation: Provide vocational rehab assistance to qualifying candidates.   Vocational Rehab Evaluation & Intervention:     Vocational Rehab - 12/20/15 1426    Initial Vocational Rehab Evaluation & Intervention   Assessment shows need for Vocational Rehabilitation No      Education: Education Goals: Education classes will be provided on a weekly basis, covering required topics. Participant will state understanding/return demonstration of topics presented.  Learning Barriers/Preferences:     Learning Barriers/Preferences - 12/20/15 1425    Learning Barriers/Preferences   Learning Barriers None   Learning Preferences None       Education Topics: General Nutrition Guidelines/Fats and Fiber: -Group instruction provided by verbal, written material, models and posters to present the general guidelines for heart healthy nutrition. Gives an explanation and review of dietary fats and fiber.   Controlling Sodium/Reading Food Labels: -Group verbal and written material supporting the discussion of sodium use in heart healthy nutrition. Review and explanation with models, verbal and written materials for utilization of the food label.   Exercise Physiology & Risk Factors: - Group verbal and written instruction with models to review the exercise physiology of the cardiovascular system and associated critical values. Details cardiovascular disease risk factors and the goals associated with each risk factor.   Aerobic Exercise & Resistance Training: - Gives group verbal and written discussion on the health impact of inactivity. On the components of aerobic and resistive training programs and the benefits of this training and how to safely progress through these programs.   Flexibility, Balance, General Exercise Guidelines: - Provides group verbal and written instruction on the benefits of flexibility and balance training programs. Provides general exercise guidelines with specific guidelines to those with heart or lung disease. Demonstration and skill practice provided.   Stress Management: - Provides group verbal and written instruction about the health risks of elevated stress, cause of high stress, and healthy ways to reduce stress.   Depression: - Provides group verbal and written instruction on the correlation between heart/lung disease and depressed mood, treatment options, and the stigmas associated with seeking treatment.   Anatomy & Physiology of the Heart: - Group verbal and written instruction and models provide basic cardiac anatomy and physiology, with the coronary electrical and arterial systems. Review  of: AMI, Angina, Valve disease, Heart Failure, Cardiac Arrhythmia, Pacemakers, and the ICD.   Cardiac Procedures: - Group verbal and written instruction and models to describe the testing methods done to diagnose heart disease. Reviews the outcomes of the test results. Describes the treatment choices: Medical Management, Angioplasty, or Coronary Bypass Surgery.   Cardiac Medications: - Group verbal and written instruction to review commonly prescribed medications for heart disease. Reviews the medication, class of the drug, and side effects. Includes the steps to properly store meds and maintain the prescription regimen.   Go Sex-Intimacy & Heart Disease, Get SMART - Goal Setting: - Group verbal and written instruction through game format to discuss heart disease and the return to sexual intimacy. Provides group verbal and written material to discuss and apply goal setting through the application of the S.M.A.R.T. Method.   Other Matters of the Heart: - Provides group verbal, written materials and models to describe Heart Failure, Angina, Valve Disease, and Diabetes in the realm of heart disease. Includes description of the disease process and treatment options available to the cardiac patient.  Exercise & Equipment Safety: - Individual verbal instruction and demonstration of equipment use and safety with use of the equipment.          Cardiac Rehab from 12/20/2015 in [facility] Cardiac and Pulmonary Rehab   Date  12/20/15   Educator  C. EnterkinRN   Instruction Review Code  1- partially meets, needs review/practice      Infection Prevention: - Provides verbal and written material to individual with discussion of infection control including proper hand washing and proper equipment cleaning during exercise session.      Cardiac Rehab from 12/20/2015 in [facility] Cardiac and Pulmonary Rehab   Date  12/20/15   Educator  C. McKenzie   Instruction Review Code  1- partially meets, needs  review/practice      Falls Prevention: - Provides verbal and written material to individual with discussion of falls prevention and safety.      Cardiac Rehab from 12/20/2015 in [facility] Cardiac and Pulmonary Rehab   Date  12/20/15   Educator  C. Crugers   Instruction Review Code  2- meets goals/outcomes      Diabetes: - Individual verbal and written instruction to review signs/symptoms of diabetes, desired ranges of glucose level fasting, after meals and with exercise. Advice that pre and post exercise glucose checks will be done for 3 sessions at entry of program.    Knowledge Questionnaire Score:     Knowledge Questionnaire Score - 12/20/15 1425    Knowledge Questionnaire Score   Pre Score 25      Core Components/Risk Factors/Patient Goals at Admission:     Personal Goals and Risk Factors at Admission - 12/20/15 1433    Core Components/Risk Factors/Patient Goals on Admission   Tobacco Cessation Yes   Expected Outcomes Long Term: Complete abstinence from all tobacco products for at least 12 months from quit date.  Short term-cont to abstain for smoking-Quit Oct 05, 2015.   Hypertension Yes   Intervention Provide education on lifestyle modifcations including regular physical activity/exercise, weight management, moderate sodium restriction and increased consumption of fresh fruit, vegetables, and low fat dairy, alcohol moderation, and smoking cessation.;Monitor prescription use compliance.   Expected Outcomes Short Term: Continued assessment and intervention until BP is < 140/86m HG in hypertensive participants. < 130/868mHG in hypertensive participants with diabetes, heart failure or chronic kidney disease.;Long Term: Maintenance of blood pressure at goal levels.   Lipids Yes   Intervention Provide education and support for participant on nutrition & aerobic/resistive exercise along with prescribed medications to achieve LDL <7076mHDL >68m75m Expected Outcomes Short Term:  Participant states understanding of desired cholesterol values and is compliant with medications prescribed. Participant is following exercise prescription and nutrition guidelines.;Long Term: Cholesterol controlled with medications as prescribed, with individualized exercise RX and with personalized nutrition plan. Value goals: LDL < 70mg14mL > 40 mg.      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 No money coming in when [facility]. His wife is a stay at home Mom for their 3 yea10 old. [name] dOctavia Bruckneres his 2 children to school  opposite side of town so he may possibly be a little late for Cardiac Rehab. Will alternate the 8am and 8:30am class.  [name] said he has no desire to return to smoking since he felt like he couldn't breath when he woke up from surgery with the breathing tube in. [name] reports his FAther died of COPD.  71 Day Review. Continue with the ITP. Sporadic attendance because of work schedule. PLans to return next week. 30 day review.  Continue with ITP  Last visit 12/24/2015       Comments:

## 2016-02-12 ENCOUNTER — Ambulatory Visit: Payer: BLUE CROSS/BLUE SHIELD

## 2016-02-18 ENCOUNTER — Encounter: Payer: BLUE CROSS/BLUE SHIELD | Attending: Cardiology

## 2016-02-18 DIAGNOSIS — Z951 Presence of aortocoronary bypass graft: Secondary | ICD-10-CM | POA: Insufficient documentation

## 2016-02-19 ENCOUNTER — Ambulatory Visit: Payer: BLUE CROSS/BLUE SHIELD

## 2016-02-26 ENCOUNTER — Ambulatory Visit: Payer: BLUE CROSS/BLUE SHIELD

## 2016-03-04 ENCOUNTER — Ambulatory Visit: Payer: BLUE CROSS/BLUE SHIELD

## 2016-03-09 ENCOUNTER — Telehealth: Payer: Self-pay | Admitting: *Deleted

## 2016-03-09 ENCOUNTER — Encounter: Payer: Self-pay | Admitting: *Deleted

## 2016-03-09 DIAGNOSIS — Z951 Presence of aortocoronary bypass graft: Secondary | ICD-10-CM

## 2016-03-09 NOTE — Progress Notes (Signed)
Cardiac Individual Treatment Plan  Patient Details  Name: Gregg [name] MRN: [id number] Date of Birth: 05/08/1976 Referring Provider:    Initial Encounter Date:       Cardiac Rehab from 12/20/2015 in [facility] Re[facility]   Date  12/20/15      Visit Diagnosis: S/P CABG x 3  Patient's Home Medications on Admission:  Current outpatient prescriptions:  .  amiodarone (PACERONE) 200 MG tablet, Take 1 tablet by mouth daily., Disp: , Rfl:  .  aspirin EC 81 MG tablet, Take 81 mg by mouth daily., Disp: , Rfl:  .  atorvastatin (LIPITOR) 80 MG tablet, Take 1 tablet by mouth daily., Disp: , Rfl:  .  furosemide (LASIX) 20 MG tablet, Take 1 tablet by mouth daily., Disp: , Rfl:  .  gabapentin (NEURONTIN) 100 MG capsule, Take 1 capsule by mouth 3 (three) times daily., Disp: , Rfl:  .  lisinopril (PRINIVIL,ZESTRIL) 5 MG tablet, Take by mouth., Disp: , Rfl:  .  metoprolol succinate (TOPROL-XL) 50 MG 24 hr tablet, Take 1 tablet by mouth daily., Disp: , Rfl:  .  oxyCODONE (OXY IR/ROXICODONE) 5 MG immediate release tablet, Take 1 tablet by mouth every 4 (four) hours as needed. , Disp: , Rfl:   Past Medical History: Past Medical History  Diagnosis Date  . Hypertension   . High cholesterol   . ST elevation (STEMI) myocardial infarction involving left anterior descending coronary artery [facility]) Jan 2016  . Tobacco use   . Coronary artery disease   . CHF (congestive heart failure) (HCC)     Tobacco Use: History  Smoking status  . Former Smoker -- 1.50 packs/day for 20 years  . Types: Cigarettes  . Quit date: 10/05/2015  Smokeless tobacco  . Not on file    Labs: Recent Review Flowsheet Data    There is no flowsheet data to display.       Exercise Target Goals:    Exercise Program Goal: Individual exercise prescription set with THRR, safety & activity barriers. Participant demonstrates ability to understand and report RPE using BORG scale, to self-measure pulse accurately,  and to acknowledge the importance of the exercise prescription.  Exercise Prescription Goal: Starting with aerobic activity 30 plus minutes a day, 3 days per week for initial exercise prescription. Provide home exercise prescription and guidelines that participant acknowledges understanding prior to discharge.  Activity Barriers & Risk Stratification:     Activity Barriers & Cardiac Risk Stratification - 12/20/15 1425    Activity Barriers & Cardiac Risk Stratification   Activity Barriers None   Cardiac Risk Stratification High      6 Minute Walk:     6 Minute Walk      12/20/15 1430       6 Minute Walk   Phase Initial     Distance 1520 feet     Walk Time 6 minutes     RPE 9     Resting HR 78 bpm     Resting BP 138/82 mmHg     Max Ex. HR 110 bpm     Max Ex. BP 148/84 mmHg        Initial Exercise Prescription:     Initial Exercise Prescription - 12/20/15 1400    Date of Initial Exercise RX and Referring Provider   Date 12/20/15   Treadmill   MPH 3   Grade 0   Minutes 10   Recumbant Bike   Level 2   RPM 40  Watts 20   Minutes 10   NuStep   Level 2   Watts 50   Minutes 10   Arm Ergometer   Level 1   Watts 10   Minutes 10   Recumbant Elliptical   Level 3   RPM 45   Watts 25   Minutes 10   Elliptical   Level 1   Speed 3   Minutes 1   REL-XR   Level 3   Watts 60   Minutes 10   T5 Nustep   Level 2   Watts 25   Minutes 10   Biostep-RELP   Level 3   Watts 60   Minutes 10   Prescription Details   Frequency (times per week) 3   Duration Progress to 30 minutes of continuous aerobic without signs/symptoms of physical distress   Intensity   THRR REST +  30   Ratings of Perceived Exertion 11-15   Perceived Dyspnea 2-4   Progression   Progression Continue progressive overload as per policy without signs/symptoms or physical distress.   Resistance Training   Training Prescription Yes   Weight 2   Reps 10-15      Perform Capillary Blood  Glucose checks as needed.  Exercise Prescription Changes:     Exercise Prescription Changes      12/24/15 1107           Exercise Review   Progression No  Only one visit this month, 12/24/15       Response to Exercise   Blood Pressure (Admit) 128/70 mmHg       Blood Pressure (Exercise) 162/82 mmHg       Blood Pressure (Exit) 112/72 mmHg       Heart Rate (Admit) 115 bpm       Heart Rate (Exercise) 136 bpm       Heart Rate (Exit) 100 bpm       Rating of Perceived Exertion (Exercise) 7       Symptoms None       Comments First day of exercise! Patient was oriented to the gym and the equipment functions and settings. Procedures and policies of the gym were outlined and explained. The patient's individual exercise prescription and treatment plan were reviewed with them. All starting workloads were established based on the results of the functional testing  done at the initial intake visit. The plan for exercise progression was also introduced and progression will be customized based on the patient's performance and goals.        Duration Progress to 30 minutes of continuous aerobic without signs/symptoms of physical distress       Intensity Rest + 30       Resistance Training   Training Prescription Yes       Weight 2       Reps 10-15       Treadmill   MPH 3       Grade 0       Minutes 10       Recumbant Bike   Level 2       RPM 40       Watts 20       Minutes 10       NuStep   Level 2       Watts 50       Minutes 10       Arm Ergometer   Level 1       Watts 10  Minutes 10       Recumbant Elliptical   Level 3       RPM 45       Watts 25       Minutes 10       Elliptical   Level 1       Speed 3       Minutes 1       REL-XR   Level 3       Watts 60       Minutes 10       T5 Nustep   Level 2       Watts 25       Minutes 10       Biostep-RELP   Level 3       Watts 60       Minutes 10          Exercise Comments:     Exercise Comments      02/08/16  1136 03/03/16 1342         Exercise Comments Gregg [name] attended one session of Heart Track on 12-24-15.  His work schedule has prevented him from returning.  Goal is to overcome barriers to attendance at [place]. Gregg [name] has not attended Heart Track since 12-24-15.  Goal is to determine obstacles to attendance.         Discharge Exercise Prescription (Final Exercise Prescription Changes):     Exercise Prescription Changes - 12/24/15 1107    Exercise Review   Progression No  Only one visit this month, 12/24/15   Response to Exercise   Blood Pressure (Admit) 128/70 mmHg   Blood Pressure (Exercise) 162/82 mmHg   Blood Pressure (Exit) 112/72 mmHg   Heart Rate (Admit) 115 bpm   Heart Rate (Exercise) 136 bpm   Heart Rate (Exit) 100 bpm   Rating of Perceived Exertion (Exercise) 7   Symptoms None   Comments First day of exercise! Patient was oriented to the gym and the equipment functions and settings. Procedures and policies of the gym were outlined and explained. The patient's individual exercise prescription and treatment plan were reviewed with them. All starting workloads were established based on the results of the functional testing  done at the initial intake visit. The plan for exercise progression was also introduced and progression will be customized based on the patient's performance and goals.    Duration Progress to 30 minutes of continuous aerobic without signs/symptoms of physical distress   Intensity Rest + 30   Resistance Training   Training Prescription Yes   Weight 2   Reps 10-15   Treadmill   MPH 3   Grade 0   Minutes 10   Recumbant Bike   Level 2   RPM 40   Watts 20   Minutes 10   NuStep   Level 2   Watts 50   Minutes 10   Arm Ergometer   Level 1   Watts 10   Minutes 10   Recumbant Elliptical   Level 3   RPM 45   Watts 25   Minutes 10   Elliptical   Level 1   Speed 3   Minutes 1   REL-XR   Level 3   Watts 60   Minutes 10   T5 Nustep   Level 2   Watts  25   Minutes 10   Biostep-RELP   Level 3   Watts 60   Minutes 10  Nutrition:  Target Goals: Understanding of nutrition guidelines, daily intake of sodium '1500mg'$ , cholesterol '200mg'$ , calories 30% from fat and 7% or less from saturated fats, daily to have 5 or more servings of fruits and vegetables.  Biometrics:     Pre Biometrics - 12/20/15 1432    Pre Biometrics   Height 5' 5.6" (1.666 m)   Weight 195 lb 12.8 oz (88.814 kg)   Waist Circumference 41 inches   Hip Circumference 39.25 inches   Waist to Hip Ratio 1.04 %   BMI (Calculated) 32.1       Nutrition Therapy Plan and Nutrition Goals:     Nutrition Therapy & Goals - 12/27/15 1803    Personal Nutrition Goals   Comments Patient declines RD meeting      Nutrition Discharge: Rate Your Plate Scores:     Nutrition Assessments - 12/27/15 1803    Rate Your Plate Scores   Pre Score 69   Pre Score % 77 %      Nutrition Goals Re-Evaluation:   Psychosocial: Target Goals: Acknowledge presence or absence of depression, maximize coping skills, provide positive support system. Participant is able to verbalize types and ability to use techniques and skills needed for reducing stress and depression.  Initial Review & Psychosocial Screening:     Initial Psych Review & Screening - 12/20/15 1441    Family Dynamics   Comments Gregg [name] reports he has no desire to quit smoking even though his wife still smokes. He said he felt like he couldn't breath when he woke up from his CABG plus his Dad died from COPD. Gregg [name] reports his father had no quality of life his last couple of years before he died since he was at home with oxygen.       Quality of Life Scores:     Quality of Life - 12/20/15 1439    Quality of Life Scores   Health/Function Pre 24.7 %   Socioeconomic Pre 24.14 %   Psych/Spiritual Pre 27.93 %   Family Pre 26.4 %   GLOBAL Pre 25.5 %      PHQ-9:     Recent Review Flowsheet Data    Depression screen [facility]  2/9 12/20/2015   Decreased Interest 0   Down, Depressed, Hopeless 0   PHQ - 2 Score 0   Altered sleeping 0   Tired, decreased energy 1   Change in appetite 2   Feeling bad or failure about yourself  0   Trouble concentrating 0   Moving slowly or fidgety/restless 0   Suicidal thoughts 0   PHQ-9 Score 3   Difficult doing work/chores Not difficult at all      Psychosocial Evaluation and Intervention:     Psychosocial Evaluation - 12/24/15 1008    Psychosocial Evaluation & Interventions   Interventions Relaxation education;Stress management education;Encouraged to exercise with the program and follow exercise prescription   Comments Counselor met with Mr. Werling today for initial psychosocial evaluation.  He is a 40 year old who had triple bypass surgery several months ago.  He has a strong support system with a spouse of 22 years and other close family who live close by.  He reports that he sleeps well and his appetite varies since the surgery.  He denies a history of depression or anxiety or any current symptoms.  He states his mood is typically positive although he is unable to resume his normal work duties.  Mr. Swiss has goals to lose weight and  get back his strength and normal life while in this program.  Counselor recommended he meet with the dietician to address his weight loss goals.        Psychosocial Re-Evaluation:   Vocational Rehabilitation: Provide vocational rehab assistance to qualifying candidates.   Vocational Rehab Evaluation & Intervention:     Vocational Rehab - 12/20/15 1426    Initial Vocational Rehab Evaluation & Intervention   Assessment shows need for Vocational Rehabilitation No      Education: Education Goals: Education classes will be provided on a weekly basis, covering required topics. Participant will state understanding/return demonstration of topics presented.  Learning Barriers/Preferences:     Learning Barriers/Preferences - 12/20/15 1425     Learning Barriers/Preferences   Learning Barriers None   Learning Preferences None      Education Topics: General Nutrition Guidelines/Fats and Fiber: -Group instruction provided by verbal, written material, models and posters to present the general guidelines for heart healthy nutrition. Gives an explanation and review of dietary fats and fiber.   Controlling Sodium/Reading Food Labels: -Group verbal and written material supporting the discussion of sodium use in heart healthy nutrition. Review and explanation with models, verbal and written materials for utilization of the food label.   Exercise Physiology & Risk Factors: - Group verbal and written instruction with models to review the exercise physiology of the cardiovascular system and associated critical values. Details cardiovascular disease risk factors and the goals associated with each risk factor.   Aerobic Exercise & Resistance Training: - Gives group verbal and written discussion on the health impact of inactivity. On the components of aerobic and resistive training programs and the benefits of this training and how to safely progress through these programs.   Flexibility, Balance, General Exercise Guidelines: - Provides group verbal and written instruction on the benefits of flexibility and balance training programs. Provides general exercise guidelines with specific guidelines to those with heart or lung disease. Demonstration and skill practice provided.   Stress Management: - Provides group verbal and written instruction about the health risks of elevated stress, cause of high stress, and healthy ways to reduce stress.   Depression: - Provides group verbal and written instruction on the correlation between heart/lung disease and depressed mood, treatment options, and the stigmas associated with seeking treatment.   Anatomy & Physiology of the Heart: - Group verbal and written instruction and models provide  basic cardiac anatomy and physiology, with the coronary electrical and arterial systems. Review of: AMI, Angina, Valve disease, Heart Failure, Cardiac Arrhythmia, Pacemakers, and the ICD.   Cardiac Procedures: - Group verbal and written instruction and models to describe the testing methods done to diagnose heart disease. Reviews the outcomes of the test results. Describes the treatment choices: Medical Management, Angioplasty, or Coronary Bypass Surgery.   Cardiac Medications: - Group verbal and written instruction to review commonly prescribed medications for heart disease. Reviews the medication, class of the drug, and side effects. Includes the steps to properly store meds and maintain the prescription regimen.   Go Sex-Intimacy & Heart Disease, Get SMART - Goal Setting: - Group verbal and written instruction through game format to discuss heart disease and the return to sexual intimacy. Provides group verbal and written material to discuss and apply goal setting through the application of the S.M.A.R.T. Method.   Other Matters of the Heart: - Provides group verbal, written materials and models to describe Heart Failure, Angina, Valve Disease, and Diabetes in the realm of heart disease. Includes description  of the disease process and treatment options available to the cardiac patient.   Exercise & Equipment Safety: - Individual verbal instruction and demonstration of equipment use and safety with use of the equipment.          Cardiac Rehab from 12/20/2015 in [facility] Cardiac and Pulmonary Rehab   Date  12/20/15   Educator  C. EnterkinRN   Instruction Review Code  1- partially meets, needs review/practice      Infection Prevention: - Provides verbal and written material to individual with discussion of infection control including proper hand washing and proper equipment cleaning during exercise session.      Cardiac Rehab from 12/20/2015 in [facility] Cardiac and Pulmonary Rehab   Date   12/20/15   Educator  C. Paulden   Instruction Review Code  1- partially meets, needs review/practice      Falls Prevention: - Provides verbal and written material to individual with discussion of falls prevention and safety.      Cardiac Rehab from 12/20/2015 in [facility] Cardiac and Pulmonary Rehab   Date  12/20/15   Educator  C. Hagan   Instruction Review Code  2- meets goals/outcomes      Diabetes: - Individual verbal and written instruction to review signs/symptoms of diabetes, desired ranges of glucose level fasting, after meals and with exercise. Advice that pre and post exercise glucose checks will be done for 3 sessions at entry of program.    Knowledge Questionnaire Score:     Knowledge Questionnaire Score - 12/20/15 1425    Knowledge Questionnaire Score   Pre Score 25      Core Components/Risk Factors/Patient Goals at Admission:     Personal Goals and Risk Factors at Admission - 12/20/15 1433    Core Components/Risk Factors/Patient Goals on Admission   Tobacco Cessation Yes   Expected Outcomes Long Term: Complete abstinence from all tobacco products for at least 12 months from quit date.  Short term-cont to abstain for smoking-Quit Oct 05, 2015.   Hypertension Yes   Intervention Provide education on lifestyle modifcations including regular physical activity/exercise, weight management, moderate sodium restriction and increased consumption of fresh fruit, vegetables, and low fat dairy, alcohol moderation, and smoking cessation.;Monitor prescription use compliance.   Expected Outcomes Short Term: Continued assessment and intervention until BP is < 140/60m HG in hypertensive participants. < 130/894mHG in hypertensive participants with diabetes, heart failure or chronic kidney disease.;Long Term: Maintenance of blood pressure at goal levels.   Lipids Yes   Intervention Provide education and support for participant on nutrition & aerobic/resistive exercise along with  prescribed medications to achieve LDL '70mg'$ , HDL >'40mg'$ .   Expected Outcomes Short Term: Participant states understanding of desired cholesterol values and is compliant with medications prescribed. Participant is following exercise prescription and nutrition guidelines.;Long Term: Cholesterol controlled with medications as prescribed, with individualized exercise RX and with personalized nutrition plan. Value goals: LDL < '70mg'$ , HDL > 40 mg.      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 No money coming in when [facility]. His wife is a stay at  home Mom for their 41 year old. 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 Will alternate the 8am and 8:30am class.  Gregg [name] said he has no desire to return to smoking since he felt like he couldn't breath when he woke up from surgery with the breathing tube in. Gregg [name] reports his FAther died of COPD.  3 Day Review. Continue with the ITP. Sporadic attendance because of work schedule. PLans to return next week. 30 day review.  Continue with ITP  Last visit 12/24/2015 30 day review. Continue with ITP. Has been out since3/6. Call made today to check on status fro return. Had to leave message on voicemail.      Comments:

## 2016-03-09 NOTE — Telephone Encounter (Signed)
Called to check on status to return to program. [name] to return call and let us know his status for returning to the program

## 2016-03-11 ENCOUNTER — Ambulatory Visit: Payer: BLUE CROSS/BLUE SHIELD

## 2016-03-18 ENCOUNTER — Ambulatory Visit: Payer: BLUE CROSS/BLUE SHIELD

## 2016-04-02 ENCOUNTER — Encounter: Payer: Self-pay | Admitting: *Deleted

## 2016-04-02 DIAGNOSIS — Z951 Presence of aortocoronary bypass graft: Secondary | ICD-10-CM

## 2016-04-08 ENCOUNTER — Telehealth: Payer: Self-pay | Admitting: *Deleted

## 2016-04-08 NOTE — Telephone Encounter (Signed)
Called to check on status to return to program. LMOM 

## 2016-04-09 ENCOUNTER — Encounter: Payer: Self-pay | Admitting: *Deleted

## 2016-04-09 DIAGNOSIS — Z951 Presence of aortocoronary bypass graft: Secondary | ICD-10-CM

## 2016-04-09 NOTE — Progress Notes (Signed)
Cardiac Individual Treatment Plan  Patient Details  Name: Gregg [name] MRN: [id number] Date of Birth: 05/08/1976 Referring Provider:    Initial Encounter Date:       Cardiac Rehab from 12/20/2015 in [facility]   Date  12/20/15      Visit Diagnosis: S/P CABG x 3  Patient's Home Medications on Admission:  Current outpatient prescriptions:  .  amiodarone (PACERONE) 200 MG tablet, Take 1 tablet by mouth daily., Disp: , Rfl:  .  aspirin EC 81 MG tablet, Take 81 mg by mouth daily., Disp: , Rfl:  .  atorvastatin (LIPITOR) 80 MG tablet, Take 1 tablet by mouth daily., Disp: , Rfl:  .  furosemide (LASIX) 20 MG tablet, Take 1 tablet by mouth daily., Disp: , Rfl:  .  gabapentin (NEURONTIN) 100 MG capsule, Take 1 capsule by mouth 3 (three) times daily., Disp: , Rfl:  .  lisinopril (PRINIVIL,ZESTRIL) 5 MG tablet, Take by mouth., Disp: , Rfl:  .  metoprolol succinate (TOPROL-XL) 50 MG 24 hr tablet, Take 1 tablet by mouth daily., Disp: , Rfl:  .  oxyCODONE (OXY IR/ROXICODONE) 5 MG immediate release tablet, Take 1 tablet by mouth every 4 (four) hours as needed. , Disp: , Rfl:   Past Medical History: Past Medical History  Diagnosis Date  . Hypertension   . High cholesterol   . ST elevation (STEMI) myocardial infarction involving left anterior descending coronary artery [facility]) Jan 2016  . Tobacco use   . Coronary artery disease   . CHF (congestive heart failure) (HCC)     Tobacco Use: History  Smoking status  . Former Smoker -- 1.50 packs/day for 20 years  . Types: Cigarettes  . Quit date: 10/05/2015  Smokeless tobacco  . Not on file    Labs: Recent Review Flowsheet Data    There is no flowsheet data to display.       Exercise Target Goals:    Exercise Program Goal: Individual exercise prescription set with THRR, safety & activity barriers. Participant demonstrates ability to understand and report RPE using BORG scale, to self-measure pulse accurately,  and to acknowledge the importance of the exercise prescription.  Exercise Prescription Goal: Starting with aerobic activity 30 plus minutes a day, 3 days per week for initial exercise prescription. Provide home exercise prescription and guidelines that participant acknowledges understanding prior to discharge.  Activity Barriers & Risk Stratification:     Activity Barriers & Cardiac Risk Stratification - 12/20/15 1425    Activity Barriers & Cardiac Risk Stratification   Activity Barriers None   Cardiac Risk Stratification High      6 Minute Walk:     6 Minute Walk      12/20/15 1430       6 Minute Walk   Phase Initial     Distance 1520 feet     Walk Time 6 minutes     RPE 9     Resting HR 78 bpm     Resting BP 138/82 mmHg     Max Ex. HR 110 bpm     Max Ex. BP 148/84 mmHg        Initial Exercise Prescription:     Initial Exercise Prescription - 12/20/15 1400    Date of Initial Exercise RX and Referring Provider   Date 12/20/15   Treadmill   MPH 3   Grade 0   Minutes 10   Recumbant Bike   Level 2   RPM 40  Watts 20   Minutes 10   NuStep   Level 2   Watts 50   Minutes 10   Arm Ergometer   Level 1   Watts 10   Minutes 10   Recumbant Elliptical   Level 3   RPM 45   Watts 25   Minutes 10   Elliptical   Level 1   Speed 3   Minutes 1   REL-XR   Level 3   Watts 60   Minutes 10   T5 Nustep   Level 2   Watts 25   Minutes 10   Biostep-RELP   Level 3   Watts 60   Minutes 10   Prescription Details   Frequency (times per week) 3   Duration Progress to 30 minutes of continuous aerobic without signs/symptoms of physical distress   Intensity   THRR REST +  30   Ratings of Perceived Exertion 11-15   Perceived Dyspnea 2-4   Progression   Progression Continue progressive overload as per policy without signs/symptoms or physical distress.   Resistance Training   Training Prescription Yes   Weight 2   Reps 10-15      Perform Capillary Blood  Glucose checks as needed.  Exercise Prescription Changes:     Exercise Prescription Changes      12/24/15 1107           Exercise Review   Progression No  Only one visit this month, 12/24/15       Response to Exercise   Blood Pressure (Admit) 128/70 mmHg       Blood Pressure (Exercise) 162/82 mmHg       Blood Pressure (Exit) 112/72 mmHg       Heart Rate (Admit) 115 bpm       Heart Rate (Exercise) 136 bpm       Heart Rate (Exit) 100 bpm       Rating of Perceived Exertion (Exercise) 7       Symptoms None       Comments First day of exercise! Patient was oriented to the gym and the equipment functions and settings. Procedures and policies of the gym were outlined and explained. The patient's individual exercise prescription and treatment plan were reviewed with them. All starting workloads were established based on the results of the functional testing  done at the initial intake visit. The plan for exercise progression was also introduced and progression will be customized based on the patient's performance and goals.        Duration Progress to 30 minutes of continuous aerobic without signs/symptoms of physical distress       Intensity Rest + 30       Resistance Training   Training Prescription Yes       Weight 2       Reps 10-15       Treadmill   MPH 3       Grade 0       Minutes 10       Recumbant Bike   Level 2       RPM 40       Watts 20       Minutes 10       NuStep   Level 2       Watts 50       Minutes 10       Arm Ergometer   Level 1       Watts 10  Minutes 10       Recumbant Elliptical   Level 3       RPM 45       Watts 25       Minutes 10       Elliptical   Level 1       Speed 3       Minutes 1       REL-XR   Level 3       Watts 60       Minutes 10       T5 Nustep   Level 2       Watts 25       Minutes 10       Biostep-RELP   Level 3       Watts 60       Minutes 10          Exercise Comments:     Exercise Comments      02/08/16  1136 03/03/16 1342 04/02/16 1526       Exercise Comments Gregg [name] attended one session of Heart Track on 12-24-15.  His work schedule has prevented him from returning.  Goal is to overcome barriers to attendance at [facility]. Gregg [name] has not attended Heart Track since 12-24-15.  Goal is to determine obstacles to attendance. Pt has been out since last medical review.        Discharge Exercise Prescription (Final Exercise Prescription Changes):     Exercise Prescription Changes - 12/24/15 1107    Exercise Review   Progression No  Only one visit this month, 12/24/15   Response to Exercise   Blood Pressure (Admit) 128/70 mmHg   Blood Pressure (Exercise) 162/82 mmHg   Blood Pressure (Exit) 112/72 mmHg   Heart Rate (Admit) 115 bpm   Heart Rate (Exercise) 136 bpm   Heart Rate (Exit) 100 bpm   Rating of Perceived Exertion (Exercise) 7   Symptoms None   Comments First day of exercise! Patient was oriented to the gym and the equipment functions and settings. Procedures and policies of the gym were outlined and explained. The patient's individual exercise prescription and treatment plan were reviewed with them. All starting workloads were established based on the results of the functional testing  done at the initial intake visit. The plan for exercise progression was also introduced and progression will be customized based on the patient's performance and goals.    Duration Progress to 30 minutes of continuous aerobic without signs/symptoms of physical distress   Intensity Rest + 30   Resistance Training   Training Prescription Yes   Weight 2   Reps 10-15   Treadmill   MPH 3   Grade 0   Minutes 10   Recumbant Bike   Level 2   RPM 40   Watts 20   Minutes 10   NuStep   Level 2   Watts 50   Minutes 10   Arm Ergometer   Level 1   Watts 10   Minutes 10   Recumbant Elliptical   Level 3   RPM 45   Watts 25   Minutes 10   Elliptical   Level 1   Speed 3   Minutes 1   REL-XR   Level 3    Watts 60   Minutes 10   T5 Nustep   Level 2   Watts 25   Minutes 10   Biostep-RELP   Level 3   Watts 60  Minutes 10      Nutrition:  Target Goals: Understanding of nutrition guidelines, daily intake of sodium <1516m, cholesterol <2051m calories 30% from fat and 7% or less from saturated fats, daily to have 5 or more servings of fruits and vegetables.  Biometrics:     Pre Biometrics - 12/20/15 1432    Pre Biometrics   Height 5' 5.6" (1.666 m)   Weight 195 lb 12.8 oz (88.814 kg)   Waist Circumference 41 inches   Hip Circumference 39.25 inches   Waist to Hip Ratio 1.04 %   BMI (Calculated) 32.1       Nutrition Therapy Plan and Nutrition Goals:     Nutrition Therapy & Goals - 12/27/15 1803    Personal Nutrition Goals   Comments Patient declines RD meeting      Nutrition Discharge: Rate Your Plate Scores:     Nutrition Assessments - 12/27/15 1803    Rate Your Plate Scores   Pre Score 69   Pre Score % 77 %      Nutrition Goals Re-Evaluation:   Psychosocial: Target Goals: Acknowledge presence or absence of depression, maximize coping skills, provide positive support system. Participant is able to verbalize types and ability to use techniques and skills needed for reducing stress and depression.  Initial Review & Psychosocial Screening:     Initial Psych Review & Screening - 12/20/15 1441    Family Dynamics   Comments Gregg [name] reports he has no desire to quit smoking even though his wife still smokes. He said he felt like he couldn't breath when he woke up from his CABG plus his Dad died from COPD. Gregg [name] reports his father had no quality of life his last couple of years before he died since he was at home with oxygen.       Quality of Life Scores:     Quality of Life - 12/20/15 1439    Quality of Life Scores   Health/Function Pre 24.7 %   Socioeconomic Pre 24.14 %   Psych/Spiritual Pre 27.93 %   Family Pre 26.4 %   GLOBAL Pre 25.5 %      PHQ-9:      Recent Review Flowsheet Data    [facility]   Decreased Interest 0   Down, Depressed, Hopeless 0   PHQ - 2 Score 0   Altered sleeping 0   Tired, decreased energy 1   Change in appetite 2   Feeling bad or failure about yourself  0   Trouble concentrating 0   Moving slowly or fidgety/restless 0   Suicidal thoughts 0   PHQ-9 Score 3   Difficult doing work/chores Not difficult at all      Psychosocial Evaluation and Intervention:     Psychosocial Evaluation - 12/24/15 1008    Psychosocial Evaluation & Interventions   Interventions Relaxation education;Stress management education;Encouraged to exercise with the program and follow exercise prescription   Comments Counselor met with Mr. LaMontanyeoday for initial psychosocial evaluation.  He is a [age]ear old who had triple bypass surgery several months ago.  He has a strong support system with a spouse of 22 years and other close family who live close by.  He reports that he sleeps well and his appetite varies since the surgery.  He denies a history of depression or anxiety or any current symptoms.  He states his mood is typically positive although he is unable to resume his normal work duties.  Mr.  Louque has goals to lose weight and get back his strength and normal life while in this program.  Counselor recommended he meet with the dietician to address his weight loss goals.        Psychosocial Re-Evaluation:   Vocational Rehabilitation: Provide vocational rehab assistance to qualifying candidates.   Vocational Rehab Evaluation & Intervention:     Vocational Rehab - 12/20/15 1426    Initial Vocational Rehab Evaluation & Intervention   Assessment shows need for Vocational Rehabilitation No      Education: Education Goals: Education classes will be provided on a weekly basis, covering required topics. Participant will state understanding/return demonstration of topics presented.  Learning  Barriers/Preferences:     Learning Barriers/Preferences - 12/20/15 1425    Learning Barriers/Preferences   Learning Barriers None   Learning Preferences None      Education Topics: General Nutrition Guidelines/Fats and Fiber: -Group instruction provided by verbal, written material, models and posters to present the general guidelines for heart healthy nutrition. Gives an explanation and review of dietary fats and fiber.   Controlling Sodium/Reading Food Labels: -Group verbal and written material supporting the discussion of sodium use in heart healthy nutrition. Review and explanation with models, verbal and written materials for utilization of the food label.   Exercise Physiology & Risk Factors: - Group verbal and written instruction with models to review the exercise physiology of the cardiovascular system and associated critical values. Details cardiovascular disease risk factors and the goals associated with each risk factor.   Aerobic Exercise & Resistance Training: - Gives group verbal and written discussion on the health impact of inactivity. On the components of aerobic and resistive training programs and the benefits of this training and how to safely progress through these programs.   Flexibility, Balance, General Exercise Guidelines: - Provides group verbal and written instruction on the benefits of flexibility and balance training programs. Provides general exercise guidelines with specific guidelines to those with heart or lung disease. Demonstration and skill practice provided.   Stress Management: - Provides group verbal and written instruction about the health risks of elevated stress, cause of high stress, and healthy ways to reduce stress.   Depression: - Provides group verbal and written instruction on the correlation between heart/lung disease and depressed mood, treatment options, and the stigmas associated with seeking treatment.   Anatomy & Physiology of  the Heart: - Group verbal and written instruction and models provide basic cardiac anatomy and physiology, with the coronary electrical and arterial systems. Review of: AMI, Angina, Valve disease, Heart Failure, Cardiac Arrhythmia, Pacemakers, and the ICD.   Cardiac Procedures: - Group verbal and written instruction and models to describe the testing methods done to diagnose heart disease. Reviews the outcomes of the test results. Describes the treatment choices: Medical Management, Angioplasty, or Coronary Bypass Surgery.   Cardiac Medications: - Group verbal and written instruction to review commonly prescribed medications for heart disease. Reviews the medication, class of the drug, and side effects. Includes the steps to properly store meds and maintain the prescription regimen.   Go Sex-Intimacy & Heart Disease, Get SMART - Goal Setting: - Group verbal and written instruction through game format to discuss heart disease and the return to sexual intimacy. Provides group verbal and written material to discuss and apply goal setting through the application of the S.M.A.R.T. Method.   Other Matters of the Heart: - Provides group verbal, written materials and models to describe Heart Failure, Angina, Valve Disease, and Diabetes in  the realm of heart disease. Includes description of the disease process and treatment options available to the cardiac patient.   Exercise & Equipment Safety: - Individual verbal instruction and demonstration of equipment use and safety with use of the equipment.          Cardiac Rehab from 12/20/2015 in [facility]iac [facility]monary Rehab   Date  12/20/15   Educator  C. EnterkinRN   Instruction Review Code  1- partially meets, needs review/practice      Infection Prevention: - Provides verbal and written material to individual with discussion of infection control including proper hand washing and proper equipment cleaning during exercise session.      Cardiac  Rehab from 12/20/2015 in [facility] Cardiac and Pulmonary Rehab   Date  12/20/15   Educator  C. [place]   Instruction Review Code  1- partially meets, needs review/practice      Falls Prevention: - Provides verbal and written material to individual with discussion of falls prevention and safety.      Cardiac Rehab from 12/20/2015 in [facility] Cardiac and Pulmonary Rehab   Date  12/20/15   Educator  C. [place]   Instruction Review Code  2- meets goals/outcomes      Diabetes: - Individual verbal and written instruction to review signs/symptoms of diabetes, desired ranges of glucose level fasting, after meals and with exercise. Advice that pre and post exercise glucose checks will be done for 3 sessions at entry of program.    Knowledge Questionnaire Score:     Knowledge Questionnaire Score - 12/20/15 1425    Knowledge Questionnaire Score   Pre Score 25      Core Components/Risk Factors/Patient Goals at Admission:     Personal Goals and Risk Factors at Admission - 12/20/15 1433    Core Components/Risk Factors/Patient Goals on Admission   Tobacco Cessation Yes   Expected Outcomes Long Term: Complete abstinence from all tobacco products for at least 12 months from quit date.  Short term-cont to abstain for smoking-Quit Oct 05, 2015.   Hypertension Yes   Intervention Provide education on lifestyle modifcations including regular physical activity/exercise, weight management, moderate sodium restriction and increased consumption of fresh fruit, vegetables, and low fat dairy, alcohol moderation, and smoking cessation.;Monitor prescription use compliance.   Expected Outcomes Short Term: Continued assessment and intervention until BP is < 140/84m HG in hypertensive participants. < 130/870mHG in hypertensive participants with diabetes, heart failure or chronic kidney disease.;Long Term: Maintenance of blood pressure at goal levels.   Lipids Yes   Intervention Provide education and support for  participant on nutrition & aerobic/resistive exercise along with prescribed medications to achieve LDL <7022mHDL >50m54m Expected Outcomes Short Term: Participant states understanding of desired cholesterol values and is compliant with medications prescribed. Participant is following exercise prescription and nutrition guidelines.;Long Term: Cholesterol controlled with medications as prescribed, with individualized exercise RX and with personalized nutrition plan. Value goals: LDL < 70mg27mL > 40 mg.      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 [facility] money coming in [facility]  [facility]. His wife is a stay at home Mom for their 52 year old. 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 Will alternate the 8am and 8:30am class.  Gregg [name] said he has no desire to return to smoking since he felt like he couldn't breath when he woke up from surgery with the breathing tube in. Gregg [name] reports his FAther died of COPD.  16 Day Review. Continue with the ITP. Sporadic attendance because of work schedule. PLans to return next week. 30 day review.  Continue with ITP  Last visit 12/24/2015 30 day review. Continue with ITP. Has been out since3/6. Call made today to check on status fro return. Had to leave message on voicemail.     04/02/16 1526 04/09/16 0713         ITP Comments Pt has been out since last medical review. 30 day review.  Continue with ITP  Last visit 12/24/2015.  Has had several calls to check on return to program. Was told he wsa planning to return.  No visits since calls.           Comments:

## 2016-04-29 ENCOUNTER — Encounter: Payer: Self-pay | Admitting: *Deleted

## 2016-04-29 DIAGNOSIS — Z951 Presence of aortocoronary bypass graft: Secondary | ICD-10-CM

## 2016-04-30 ENCOUNTER — Encounter: Payer: Self-pay | Admitting: *Deleted

## 2016-04-30 DIAGNOSIS — Z951 Presence of aortocoronary bypass graft: Secondary | ICD-10-CM

## 2016-05-07 ENCOUNTER — Encounter: Payer: Self-pay | Admitting: *Deleted

## 2016-05-07 DIAGNOSIS — Z951 Presence of aortocoronary bypass graft: Secondary | ICD-10-CM

## 2016-05-07 NOTE — Progress Notes (Signed)
Cardiac Individual Treatment Plan  Patient Details  Name: Gregg [name] MRN: [id number] Date of Birth: 05/08/1976 Referring Provider:    Initial Encounter Date:       Cardiac Rehab from 12/20/2015 in [facility]   Date  12/20/15      Visit Diagnosis: S/P CABG x 3  Patient's Home Medications on Admission:  Current outpatient prescriptions:  .  amiodarone (PACERONE) 200 MG tablet, Take 1 tablet by mouth daily., Disp: , Rfl:  .  aspirin EC 81 MG tablet, Take 81 mg by mouth daily., Disp: , Rfl:  .  atorvastatin (LIPITOR) 80 MG tablet, Take 1 tablet by mouth daily., Disp: , Rfl:  .  furosemide (LASIX) 20 MG tablet, Take 1 tablet by mouth daily., Disp: , Rfl:  .  gabapentin (NEURONTIN) 100 MG capsule, Take 1 capsule by mouth 3 (three) times daily., Disp: , Rfl:  .  lisinopril (PRINIVIL,ZESTRIL) 5 MG tablet, Take by mouth., Disp: , Rfl:  .  metoprolol succinate (TOPROL-XL) 50 MG 24 hr tablet, Take 1 tablet by mouth daily., Disp: , Rfl:  .  oxyCODONE (OXY IR/ROXICODONE) 5 MG immediate release tablet, Take 1 tablet by mouth every 4 (four) hours as needed. , Disp: , Rfl:   Past Medical History: Past Medical History  Diagnosis Date  . Hypertension   . High cholesterol   . ST elevation (STEMI) myocardial infarction involving left anterior descending coronary artery [facility]) Jan 2016  . Tobacco use   . Coronary artery disease   . CHF (congestive heart failure) (HCC)     Tobacco Use: History  Smoking status  . Former Smoker -- 1.50 packs/day for 20 years  . Types: Cigarettes  . Quit date: 10/05/2015  Smokeless tobacco  . Not on file    Labs: Recent Review Flowsheet Data    There is no flowsheet data to display.       Exercise Target Goals:    Exercise Program Goal: Individual exercise prescription set with THRR, safety & activity barriers. Participant demonstrates ability to understand and report RPE using BORG scale, to self-measure pulse accurately,  and to acknowledge the importance of the exercise prescription.  Exercise Prescription Goal: Starting with aerobic activity 30 plus minutes a day, 3 days per week for initial exercise prescription. Provide home exercise prescription and guidelines that participant acknowledges understanding prior to discharge.  Activity Barriers & Risk Stratification:     Activity Barriers & Cardiac Risk Stratification - 12/20/15 1425    Activity Barriers & Cardiac Risk Stratification   Activity Barriers None   Cardiac Risk Stratification High      6 Minute Walk:     6 Minute Walk      12/20/15 1430       6 Minute Walk   Phase Initial     Distance 1520 feet     Walk Time 6 minutes     RPE 9     Resting HR 78 bpm     Resting BP 138/82 mmHg     Max Ex. HR 110 bpm     Max Ex. BP 148/84 mmHg        Initial Exercise Prescription:     Initial Exercise Prescription - 12/20/15 1400    Date of Initial Exercise RX and Referring Provider   Date 12/20/15   Treadmill   MPH 3   Grade 0   Minutes 10   Recumbant Bike   Level 2   RPM 40  Watts 20   Minutes 10   NuStep   Level 2   Watts 50   Minutes 10   Arm Ergometer   Level 1   Watts 10   Minutes 10   Recumbant Elliptical   Level 3   RPM 45   Watts 25   Minutes 10   Elliptical   Level 1   Speed 3   Minutes 1   REL-XR   Level 3   Watts 60   Minutes 10   T5 Nustep   Level 2   Watts 25   Minutes 10   Biostep-RELP   Level 3   Watts 60   Minutes 10   Prescription Details   Frequency (times per week) 3   Duration Progress to 30 minutes of continuous aerobic without signs/symptoms of physical distress   Intensity   THRR REST +  30   Ratings of Perceived Exertion 11-15   Perceived Dyspnea 2-4   Progression   Progression Continue progressive overload as per policy without signs/symptoms or physical distress.   Resistance Training   Training Prescription Yes   Weight 2   Reps 10-15      Perform Capillary Blood  Glucose checks as needed.  Exercise Prescription Changes:     Exercise Prescription Changes      12/24/15 1107           Exercise Review   Progression No  Only one visit this month, 12/24/15       Response to Exercise   Blood Pressure (Admit) 128/70 mmHg       Blood Pressure (Exercise) 162/82 mmHg       Blood Pressure (Exit) 112/72 mmHg       Heart Rate (Admit) 115 bpm       Heart Rate (Exercise) 136 bpm       Heart Rate (Exit) 100 bpm       Rating of Perceived Exertion (Exercise) 7       Symptoms None       Comments First day of exercise! Patient was oriented to the gym and the equipment functions and settings. Procedures and policies of the gym were outlined and explained. The patient's individual exercise prescription and treatment plan were reviewed with them. All starting workloads were established based on the results of the functional testing  done at the initial intake visit. The plan for exercise progression was also introduced and progression will be customized based on the patient's performance and goals.        Duration Progress to 30 minutes of continuous aerobic without signs/symptoms of physical distress       Intensity Rest + 30       Resistance Training   Training Prescription Yes       Weight 2       Reps 10-15       Treadmill   MPH 3       Grade 0       Minutes 10       Recumbant Bike   Level 2       RPM 40       Watts 20       Minutes 10       NuStep   Level 2       Watts 50       Minutes 10       Arm Ergometer   Level 1       Watts 10  Minutes 10       Recumbant Elliptical   Level 3       RPM 45       Watts 25       Minutes 10       Elliptical   Level 1       Speed 3       Minutes 1       REL-XR   Level 3       Watts 60       Minutes 10       T5 Nustep   Level 2       Watts 25       Minutes 10       Biostep-RELP   Level 3       Watts 60       Minutes 10          Exercise Comments:     Exercise Comments      02/08/16  1136 03/03/16 1342 04/02/16 1526 04/29/16 1423 04/30/16 1616   Exercise Comments [name] attended one session of Heart Track on 12-24-15.  His work schedule has prevented him from returning.  Goal is to overcome barriers to attendance at [place]. [name] has not attended Heart Track since 12-24-15.  Goal is to determine obstacles to attendance. Pt has been out since last medical review. No attendance since last review. Pt has been out since last review.      Discharge Exercise Prescription (Final Exercise Prescription Changes):     Exercise Prescription Changes - 12/24/15 1107    Exercise Review   Progression No  Only one visit this month, 12/24/15   Response to Exercise   Blood Pressure (Admit) 128/70 mmHg   Blood Pressure (Exercise) 162/82 mmHg   Blood Pressure (Exit) 112/72 mmHg   Heart Rate (Admit) 115 bpm   Heart Rate (Exercise) 136 bpm   Heart Rate (Exit) 100 bpm   Rating of Perceived Exertion (Exercise) 7   Symptoms None   Comments First day of exercise! Patient was oriented to the gym and the equipment functions and settings. Procedures and policies of the gym were outlined and explained. The patient's individual exercise prescription and treatment plan were reviewed with them. All starting workloads were established based on the results of the functional testing  done at the initial intake visit. The plan for exercise progression was also introduced and progression will be customized based on the patient's performance and goals.    Duration Progress to 30 minutes of continuous aerobic without signs/symptoms of physical distress   Intensity Rest + 30   Resistance Training   Training Prescription Yes   Weight 2   Reps 10-15   Treadmill   MPH 3   Grade 0   Minutes 10   Recumbant Bike   Level 2   RPM 40   Watts 20   Minutes 10   NuStep   Level 2   Watts 50   Minutes 10   Arm Ergometer   Level 1   Watts 10   Minutes 10   Recumbant Elliptical   Level 3   RPM 45   Watts 25    Minutes 10   Elliptical   Level 1   Speed 3   Minutes 1   REL-XR   Level 3   Watts 60   Minutes 10   T5 Nustep   Level 2   Watts 25   Minutes 10  Biostep-RELP   Level 3   Watts 60   Minutes 10      Nutrition:  Target Goals: Understanding of nutrition guidelines, daily intake of sodium <1544m, cholesterol <2031m calories 30% from fat and 7% or less from saturated fats, daily to have 5 or more servings of fruits and vegetables.  Biometrics:     Pre Biometrics - 12/20/15 1432    Pre Biometrics   Height 5' 5.6" (1.666 m)   Weight 195 lb 12.8 oz (88.814 kg)   Waist Circumference 41 inches   Hip Circumference 39.25 inches   Waist to Hip Ratio 1.04 %   BMI (Calculated) 32.1       Nutrition Therapy Plan and Nutrition Goals:     Nutrition Therapy & Goals - 12/27/15 1803    Personal Nutrition Goals   Comments Patient declines RD meeting      Nutrition Discharge: Rate Your Plate Scores:     Nutrition Assessments - 12/27/15 1803    Rate Your Plate Scores   Pre Score 69   Pre Score % 77 %      Nutrition Goals Re-Evaluation:   Psychosocial: Target Goals: Acknowledge presence or absence of depression, maximize coping skills, provide positive support system. Participant is able to verbalize types and ability to use techniques and skills needed for reducing stress and depression.  Initial Review & Psychosocial Screening:     Initial Psych Review & Screening - 12/20/15 1441    Family Dynamics   Comments [name] reports he has no desire to quit smoking even though his wife still smokes. He said he felt like he couldn't breath when he woke up from his CABG plus his Dad died from COPD. [name] reports his father had no quality of life his last couple of years before he died since he was at home with oxygen.       Quality of Life Scores:     Quality of Life - 12/20/15 1439    Quality of Life Scores   Health/Function Pre 24.7 %   Socioeconomic Pre 24.14 %    Psych/Spiritual Pre 27.93 %   Family Pre 26.4 %   GLOBAL Pre 25.5 %      PHQ-9:     Recent Review Flowsheet Data    [facility]   Decreased Interest 0   Down, Depressed, Hopeless 0   PHQ - 2 Score 0   Altered sleeping 0   Tired, decreased energy 1   Change in appetite 2   Feeling bad or failure about yourself  0   Trouble concentrating 0   Moving slowly or fidgety/restless 0   Suicidal thoughts 0   PHQ-9 Score 3   Difficult doing work/chores Not difficult at all      Psychosocial Evaluation and Intervention:     Psychosocial Evaluation - 12/24/15 1008    Psychosocial Evaluation & Interventions   Interventions Relaxation education;Stress management education;Encouraged to exercise with the program and follow exercise prescription   Comments Counselor met with Mr. LaMunteroday for initial psychosocial evaluation.  He is a [age]ear old who had triple bypass surgery several months ago.  He has a strong support system with a spouse of 22 years and other close family who live close by.  He reports that he sleeps well and his appetite varies since the surgery.  He denies a history of depression or anxiety or any current symptoms.  He states his mood is typically positive although  he is unable to resume his normal work duties.  Mr. Zuver has goals to lose weight and get back his strength and normal life while in this program.  Counselor recommended he meet with the dietician to address his weight loss goals.        Psychosocial Re-Evaluation:   Vocational Rehabilitation: Provide vocational rehab assistance to qualifying candidates.   Vocational Rehab Evaluation & Intervention:     Vocational Rehab - 12/20/15 1426    Initial Vocational Rehab Evaluation & Intervention   Assessment shows need for Vocational Rehabilitation No      Education: Education Goals: Education classes will be provided on a weekly basis, covering required topics. Participant will  state understanding/return demonstration of topics presented.  Learning Barriers/Preferences:     Learning Barriers/Preferences - 12/20/15 1425    Learning Barriers/Preferences   Learning Barriers None   Learning Preferences None      Education Topics: General Nutrition Guidelines/Fats and Fiber: -Group instruction provided by verbal, written material, models and posters to present the general guidelines for heart healthy nutrition. Gives an explanation and review of dietary fats and fiber.   Controlling Sodium/Reading Food Labels: -Group verbal and written material supporting the discussion of sodium use in heart healthy nutrition. Review and explanation with models, verbal and written materials for utilization of the food label.   Exercise Physiology & Risk Factors: - Group verbal and written instruction with models to review the exercise physiology of the cardiovascular system and associated critical values. Details cardiovascular disease risk factors and the goals associated with each risk factor.   Aerobic Exercise & Resistance Training: - Gives group verbal and written discussion on the health impact of inactivity. On the components of aerobic and resistive training programs and the benefits of this training and how to safely progress through these programs.   Flexibility, Balance, General Exercise Guidelines: - Provides group verbal and written instruction on the benefits of flexibility and balance training programs. Provides general exercise guidelines with specific guidelines to those with heart or lung disease. Demonstration and skill practice provided.   Stress Management: - Provides group verbal and written instruction about the health risks of elevated stress, cause of high stress, and healthy ways to reduce stress.   Depression: - Provides group verbal and written instruction on the correlation between heart/lung disease and depressed mood, treatment options, and  the stigmas associated with seeking treatment.   Anatomy & Physiology of the Heart: - Group verbal and written instruction and models provide basic cardiac anatomy and physiology, with the coronary electrical and arterial systems. Review of: AMI, Angina, Valve disease, Heart Failure, Cardiac Arrhythmia, Pacemakers, and the ICD.   Cardiac Procedures: - Group verbal and written instruction and models to describe the testing methods done to diagnose heart disease. Reviews the outcomes of the test results. Describes the treatment choices: Medical Management, Angioplasty, or Coronary Bypass Surgery.   Cardiac Medications: - Group verbal and written instruction to review commonly prescribed medications for heart disease. Reviews the medication, class of the drug, and side effects. Includes the steps to properly store meds and maintain the prescription regimen.   Go Sex-Intimacy & Heart Disease, Get SMART - Goal Setting: - Group verbal and written instruction through game format to discuss heart disease and the return to sexual intimacy. Provides group verbal and written material to discuss and apply goal setting through the application of the S.M.A.R.T. Method.   Other Matters of the Heart: - Provides group verbal, written materials and  models to describe Heart Failure, Angina, Valve Disease, and Diabetes in the realm of heart disease. Includes description of the disease process and treatment options available to the cardiac patient.   Exercise & Equipment Safety: - Individual verbal instruction and demonstration of equipment use and safety with use of the equipment.          Cardiac Rehab from 12/20/2015 in [facility] Cardiac and Pulmonary Rehab   Date  12/20/15   Educator  C. EnterkinRN   Instruction Review Code  1- partially meets, needs review/practice      Infection Prevention: - Provides verbal and written material to individual with discussion of infection control including proper hand  washing and proper equipment cleaning during exercise session.      Cardiac Rehab from 12/20/2015 in [facility] Re[facility]   Date  12/20/15   Educator  C. [place]   Instruction Review Code  1- partially meets, needs review/practice      Falls Prevention: - Provides verbal and written material to individual with discussion of falls prevention and safety.      Cardiac Rehab from 12/20/2015 in [facility] Cardiac and Pulmonary Rehab   Date  12/20/15   Educator  C. [place]   Instruction Review Code  2- meets goals/outcomes      Diabetes: - Individual verbal and written instruction to review signs/symptoms of diabetes, desired ranges of glucose level fasting, after meals and with exercise. Advice that pre and post exercise glucose checks will be done for 3 sessions at entry of program.    Knowledge Questionnaire Score:     Knowledge Questionnaire Score - 12/20/15 1425    Knowledge Questionnaire Score   Pre Score 25      Core Components/Risk Factors/Patient Goals at Admission:     Personal Goals and Risk Factors at Admission - 12/20/15 1433    Core Components/Risk Factors/Patient Goals on Admission   Tobacco Cessation Yes   Expected Outcomes Long Term: Complete abstinence from all tobacco products for at least 12 months from quit date.  Short term-cont to abstain for smoking-Quit Oct 05, 2015.   Hypertension Yes   Intervention Provide education on lifestyle modifcations including regular physical activity/exercise, weight management, moderate sodium restriction and increased consumption of fresh fruit, vegetables, and low fat dairy, alcohol moderation, and smoking cessation.;Monitor prescription use compliance.   Expected Outcomes Short Term: Continued assessment and intervention until BP is < 140/12m HG in hypertensive participants. < 130/849mHG in hypertensive participants with diabetes, heart failure or chronic kidney disease.;Long Term: Maintenance of blood pressure at  goal levels.   Lipids Yes   Intervention Provide education and support for participant on nutrition & aerobic/resistive exercise along with prescribed medications to achieve LDL <7059mHDL >57m57m Expected Outcomes Short Term: Participant states understanding of desired cholesterol values and is compliant with medications prescribed. Participant is following exercise prescription and nutrition guidelines.;Long Term: Cholesterol controlled with medications as prescribed, with individualized exercise RX and with personalized nutrition plan. Value goals: LDL < 70mg58mL > 40 mg.      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 No money coming  in when he had his CABG at [facility] 2 days before Christmas. His wife is a stay at home Mom for their 72 year old. 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 Will alternate the 8am and 8:30am class.  [name] said he has no desire to return to smoking since he felt like he couldn't breath when he woke up from surgery with the breathing tube in. [name] reports his FAther died of COPD.  34 Day Review. Continue with the ITP. Sporadic attendance because of work schedule. PLans to return next week. 30 day review.  Continue with ITP  Last visit 12/24/2015 30 day review. Continue with ITP. Has been out since3/6. Call made today to check on status fro return. Had to leave message on voicemail.     04/02/16 1526 04/09/16 0713 04/29/16 1423 04/30/16 1616 05/07/16 0713   ITP Comments Pt has been out since last medical review. 30 day review.  Continue with ITP  Last visit 12/24/2015.  Has had several  calls to check on return to program. Was told he wsa planning to return.  No visits since calls.  No attendance since last review. Pt has been out since last review. 30 day review. Continue with ITP Absent since 3/6. No response to calls.  Discharged from program      Comments:

## 2016-05-07 NOTE — Progress Notes (Signed)
Discharge Summary  Patient Details  Name: Gregg [name] MRN: [id number] Date of Birth: 03/05/1976 Referring Provider:     Number of Visits:   Reason for Discharge:  Early Exit:  absent since 3/6 visit. No response to calls  Smoking History:  History  Smoking status  . Former Smoker -- 1.50 packs/day for 20 years  . Types: Cigarettes  . Quit date: 10/05/2015  Smokeless tobacco  . Not on file    Diagnosis:  S/P CABG x 3  ADL [facility]:   Initial Exercise Prescription:     Initial Exercise Prescription - 12/20/15 1400    Date of Initial Exercise RX and Referring Provider   Date 12/20/15   Treadmill   MPH 3   Grade 0   Minutes 10   Recumbant Bike   Level 2   RPM 40   Watts 20   Minutes 10   NuStep   Level 2   Watts 50   Minutes 10   Arm Ergometer   Level 1   Watts 10   Minutes 10   Recumbant Elliptical   Level 3   RPM 45   Watts 25   Minutes 10   Elliptical   Level 1   Speed 3   Minutes 1   REL-XR   Level 3   Watts 60   Minutes 10   T5 Nustep   Level 2   Watts 25   Minutes 10   Biostep-RELP   Level 3   Watts 60   Minutes 10   Prescription Details   Frequency (times per week) 3   Duration Progress to 30 minutes of continuous aerobic without signs/symptoms of physical distress   Intensity   THRR REST +  30   Ratings of Perceived Exertion 11-15   Perceived Dyspnea 2-4   Progression   Progression Continue progressive overload as per policy without signs/symptoms or physical distress.   Resistance Training   Training Prescription Yes   Weight 2   Reps 10-15      Discharge Exercise Prescription (Final Exercise Prescription Changes):     Exercise Prescription Changes - 12/24/15 1107    Exercise Review   Progression No  Only one visit this month, 12/24/15   Response to Exercise   Blood Pressure (Admit) 128/70 mmHg   Blood Pressure (Exercise) 162/82 mmHg   Blood Pressure (Exit) 112/72 mmHg   Heart Rate (Admit) 115 bpm   Heart Rate  (Exercise) 136 bpm   Heart Rate (Exit) 100 bpm   Rating of Perceived Exertion (Exercise) 7   Symptoms None   Comments First day of exercise! Patient was oriented to the gym and the equipment functions and settings. Procedures and policies of the gym were outlined and explained. The patient's individual exercise prescription and treatment plan were reviewed with them. All starting workloads were established based on the results of the functional testing  done at the initial intake visit. The plan for exercise progression was also introduced and progression will be customized based on the patient's performance and goals.    Duration Progress to 30 minutes of continuous aerobic without signs/symptoms of physical distress   Intensity Rest + 30   Resistance Training   Training Prescription Yes   Weight 2   Reps 10-15   Treadmill   MPH 3   Grade 0   Minutes 10   Recumbant Bike   Level 2   RPM 40   Watts 20   Minutes  10   NuStep   Level 2   Watts 50   Minutes 10   Arm Ergometer   Level 1   Watts 10   Minutes 10   Recumbant Elliptical   Level 3   RPM 45   Watts 25   Minutes 10   Elliptical   Level 1   Speed 3   Minutes 1   REL-XR   Level 3   Watts 60   Minutes 10   T5 Nustep   Level 2   Watts 25   Minutes 10   Biostep-RELP   Level 3   Watts 60   Minutes 10      Functional Capacity:     6 Minute Walk      12/20/15 1430       6 Minute Walk   Phase Initial     Distance 1520 feet     Walk Time 6 minutes     RPE 9     Resting HR 78 bpm     Resting BP 138/82 mmHg     Max Ex. HR 110 bpm     Max Ex. BP 148/84 mmHg        Psychological, QOL, Others - Outcomes: PHQ 2/9: Depression screen PHQ 2/9 12/20/2015  Decreased Interest 0  Down, Depressed, Hopeless 0  PHQ - 2 Score 0  Altered sleeping 0  Tired, decreased energy 1  Change in appetite 2  Feeling bad or failure about yourself  0  Trouble concentrating 0  Moving slowly or fidgety/restless 0  Suicidal  thoughts 0  PHQ-9 Score 3  Difficult doing work/chores Not difficult at all    Quality of Life:     Quality of Life - 12/20/15 1439    Quality of Life Scores   Health/Function Pre 24.7 %   Socioeconomic Pre 24.14 %   Psych/Spiritual Pre 27.93 %   Family Pre 26.4 %   GLOBAL Pre 25.5 %      Personal Goals: Goals established at orientation with interventions provided to work toward goal.     Personal Goals and Risk Factors at Admission - 12/20/15 1433    Core Components/Risk Factors/Patient Goals on Admission   Tobacco Cessation Yes   Expected Outcomes Long Term: Complete abstinence from all tobacco products for at least 12 months from quit date.  Short term-cont to abstain for smoking-Quit Oct 05, 2015.   Hypertension Yes   Intervention Provide education on lifestyle modifcations including regular physical activity/exercise, weight management, moderate sodium restriction and increased consumption of fresh fruit, vegetables, and low fat dairy, alcohol moderation, and smoking cessation.;Monitor prescription use compliance.   Expected Outcomes Short Term: Continued assessment and intervention until BP is < 140/3390mm HG in hypertensive participants. < 130/7180mm HG in hypertensive participants with diabetes, heart failure or chronic kidney disease.;Long Term: Maintenance of blood pressure at goal levels.   Lipids Yes   Intervention Provide education and support for participant on nutrition & aerobic/resistive exercise along with prescribed medications to achieve LDL 70mg , HDL >40mg .   Expected Outcomes Short Term: Participant states understanding of desired cholesterol values and is compliant with medications prescribed. Participant is following exercise prescription and nutrition guidelines.;Long Term: Cholesterol controlled with medications as prescribed, with individualized exercise RX and with personalized nutrition plan. Value goals: LDL < 70mg , HDL > 40 mg.       Personal Goals  Discharge:   Nutrition & Weight - Outcomes:     Pre Biometrics -  12/20/15 1432    Pre Biometrics   Height 5' 5.6" (1.666 m)   Weight 195 lb 12.8 oz (88.814 kg)   Waist Circumference 41 inches   Hip Circumference 39.25 inches   Waist to Hip Ratio 1.04 %   BMI (Calculated) 32.1       Nutrition:     Nutrition Therapy & Goals - 12/27/15 1803    Personal Nutrition Goals   Comments Patient declines RD meeting      Nutrition Discharge:     Nutrition Assessments - 12/27/15 1803    Rate Your Plate Scores   Pre Score 69   Pre Score % 77 %      Education Questionnaire Score:     Knowledge Questionnaire Score - 12/20/15 1425    Knowledge Questionnaire Score   Pre Score 25      Goals reviewed with patient; copy given to patient.

## 2016-05-13 ENCOUNTER — Encounter: Payer: Self-pay | Admitting: *Deleted

## 2016-05-13 DIAGNOSIS — Z951 Presence of aortocoronary bypass graft: Secondary | ICD-10-CM

## 2016-12-14 IMAGING — CR DG CHEST 1V PORT
1 series · 1 of 1 positions shown · non-contrast
Comparison: 10/05/2015

CLINICAL DATA: Swelling in hands arms and feet. Fever. Recent
bypass surgery.

EXAM:
PORTABLE CHEST 1 VIEW

[ap]
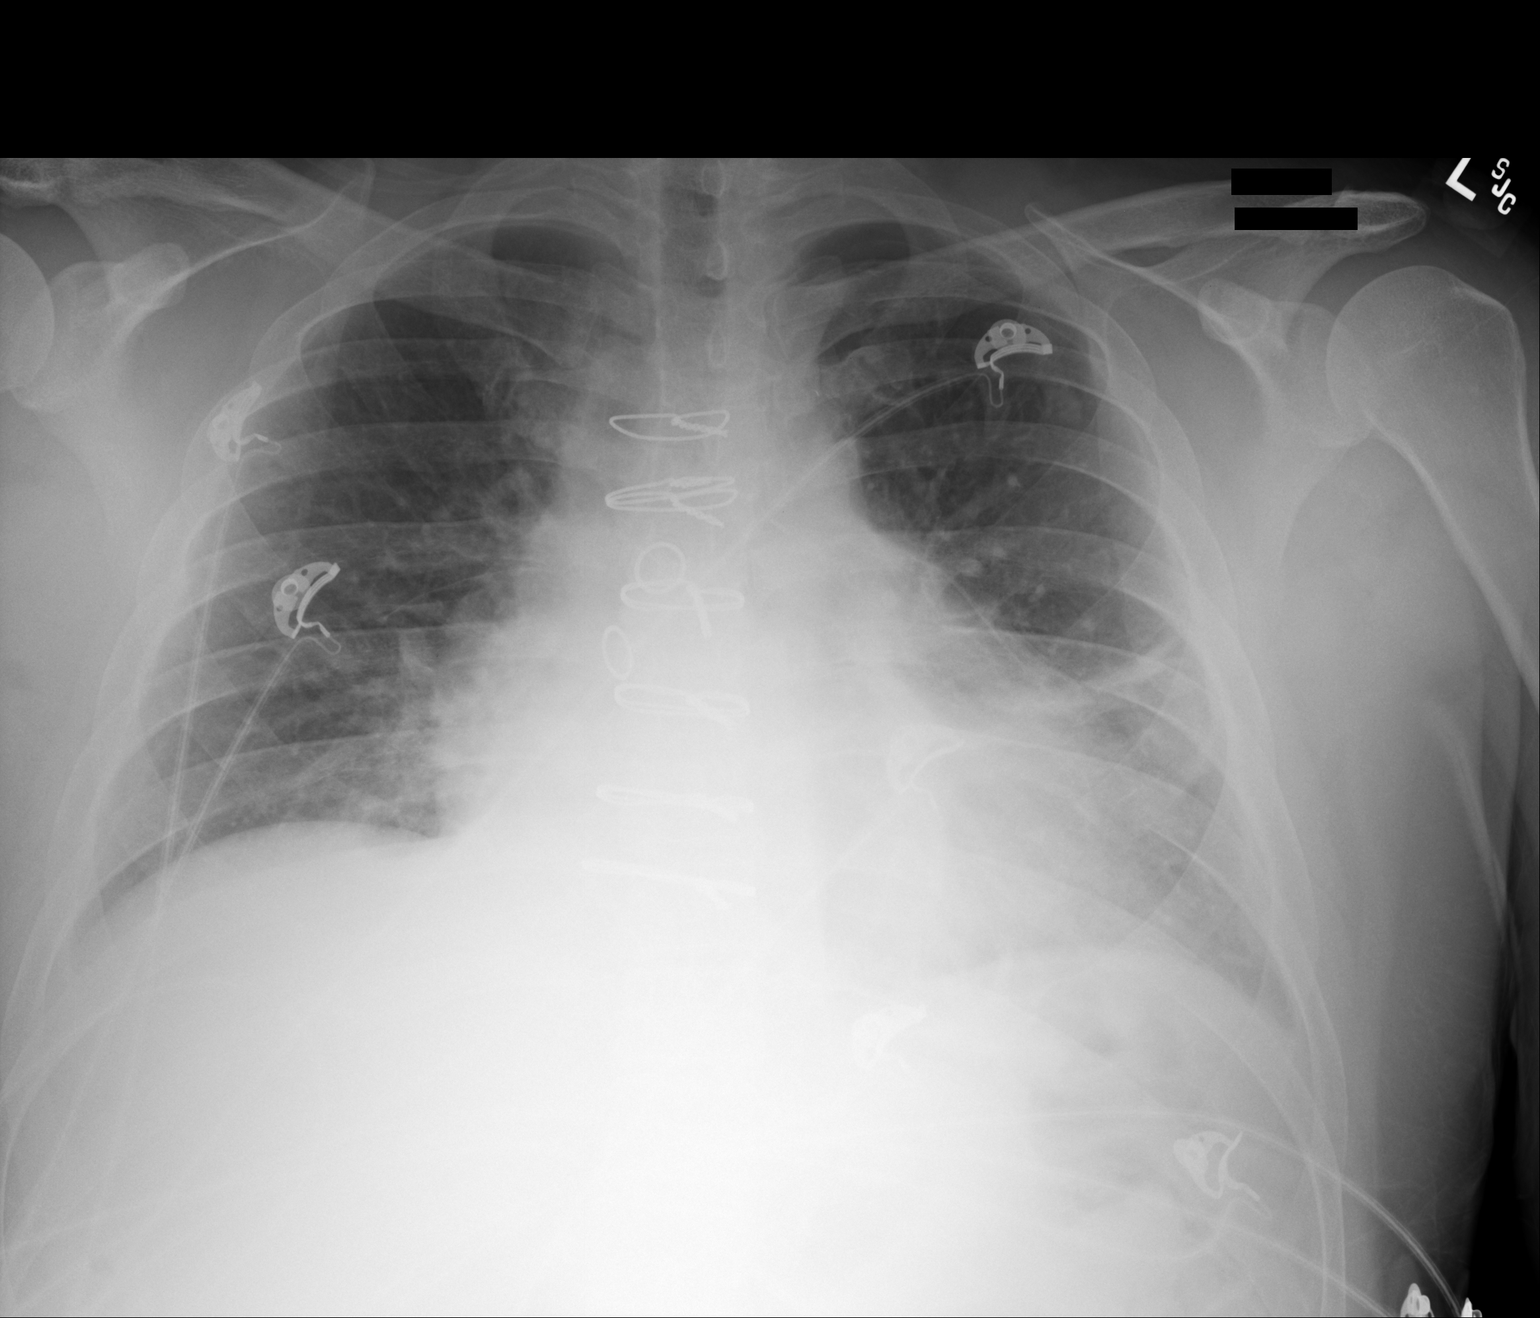

[1 of 1 positions shown; findings below may reference images not displayed]

FINDINGS: Prior median sternotomy. Midline trachea. Cardiomegaly accentuated
by AP portable technique. No pleural fluid. Approximately 5% left
apical pneumothorax. Visceral pleural line 7 mm from chest wall.
Diminished lung volumes. Right hemidiaphragm elevation. Mild
pulmonary interstitial thickening. Subsegmental atelectasis in the
left mid lung.
IMPRESSION: Cardiomegaly. Mild pulmonary interstitial prominence which could be
due to AP portable technique and low lung volumes or mild pulmonary
venous congestion.

Approximately 5% left apical pneumothorax.

Critical test results telephoned to Dr. Shiri... at the time of

## 2016-12-14 IMAGING — CT CT ANGIO CHEST
1 of 2 series · 18 of 30 positions shown · IV contrast (APPLIED)
Comparison: October 16, 2015 chest radiograph

CLINICAL DATA: Shortness of breath. Recent coronary artery bypass
graft procedure

EXAM:
CT ANGIOGRAPHY CHEST WITH CONTRAST
TECHNIQUE: Multidetector CT imaging of the chest was performed using the
standard protocol during bolus administration of intravenous
contrast. Multiplanar CT image reconstructions and MIPs were
obtained to evaluate the vascular anatomy.
CONTRAST:  75mL OMNIPAQUE IOHEXOL 350 MG/ML SOLN

[Series 5: pe 1.0 thins · axial · 0.72mm/px · z∈[-357,-94]mm · 18 of 297 slices shown]
[im 17/297  lung]
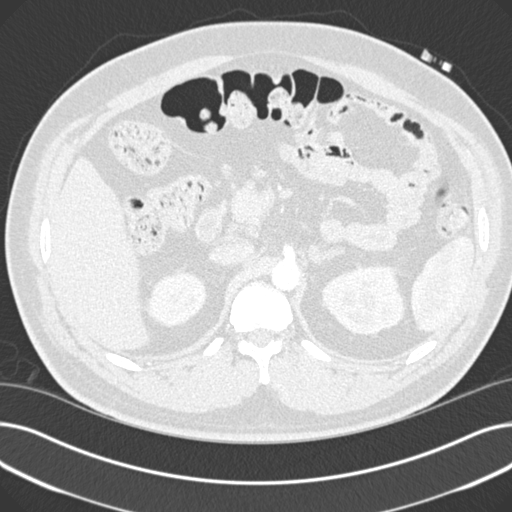
[im 33/297  mediastinal]
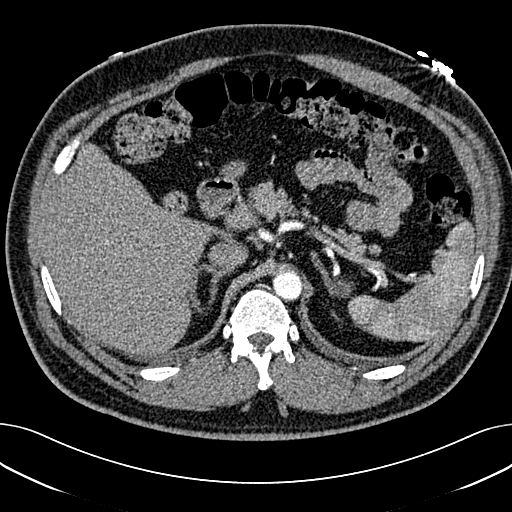
[im 50/297  lung]
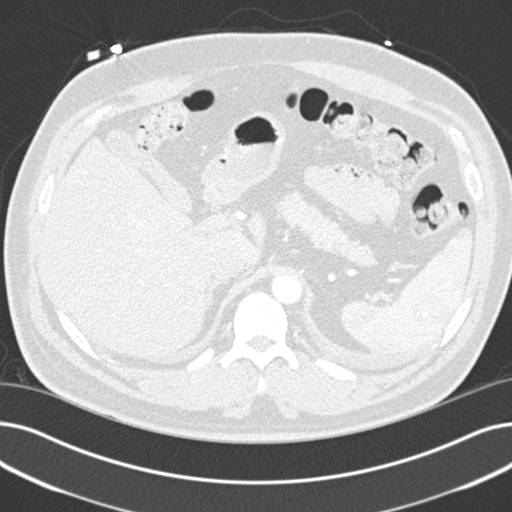
[im 66/297  mediastinal]
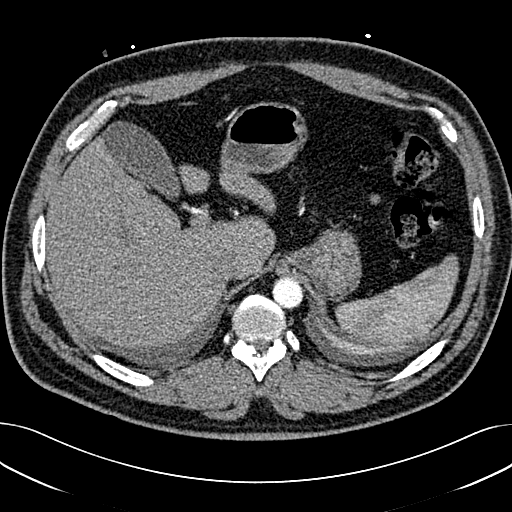
[im 83/297  lung]
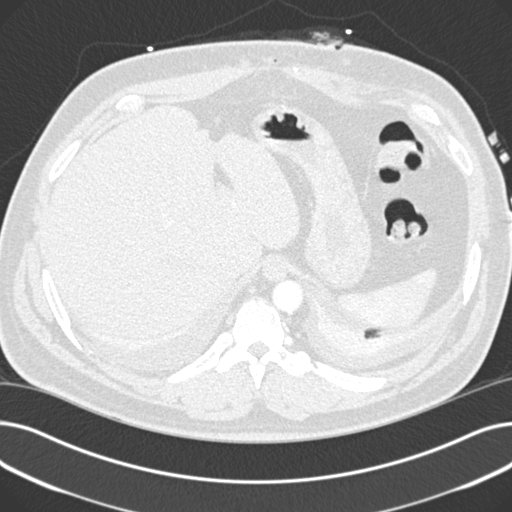
[im 99/297  mediastinal]
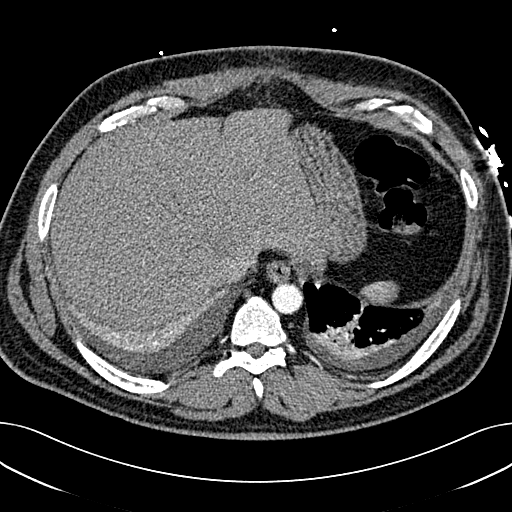
[im 116/297  lung]
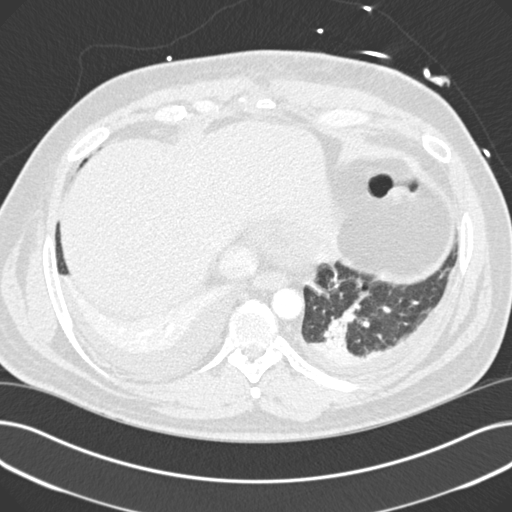
[im 132/297  mediastinal]
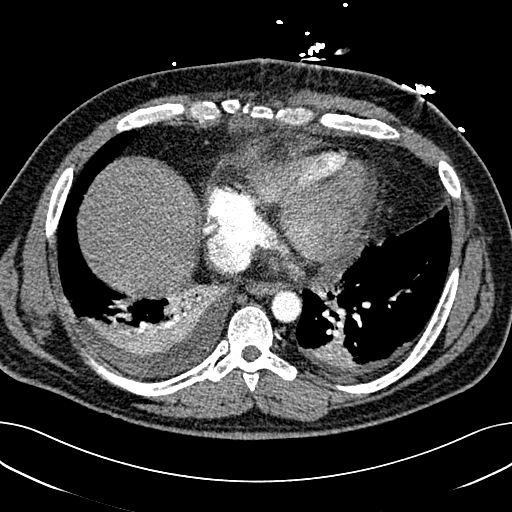
[im 139/297  lung]
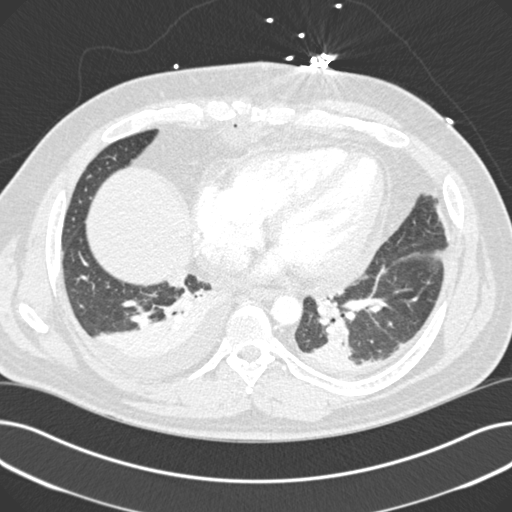
[im 149/297  mediastinal]
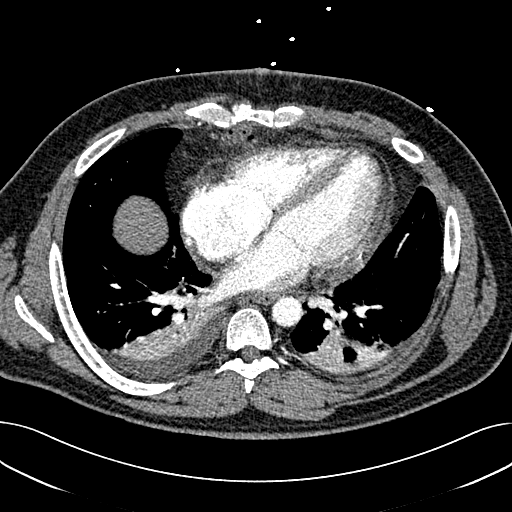
[im 165/297  lung]
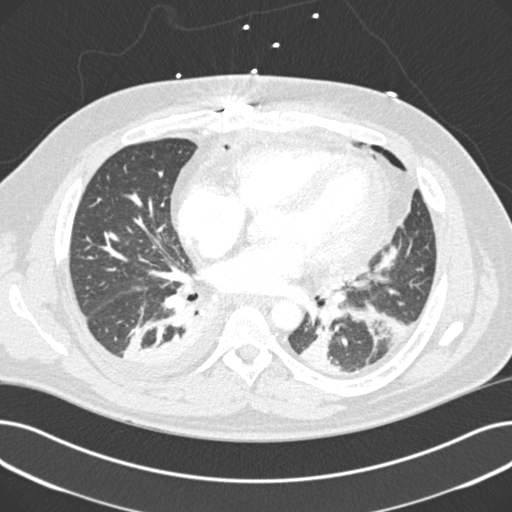
[im 181/297  mediastinal]
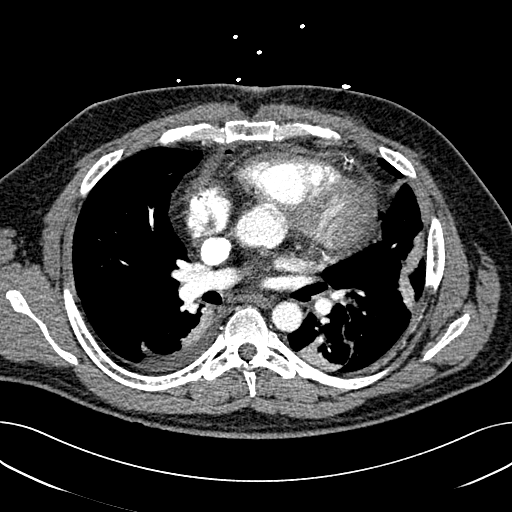
[im 198/297  lung]
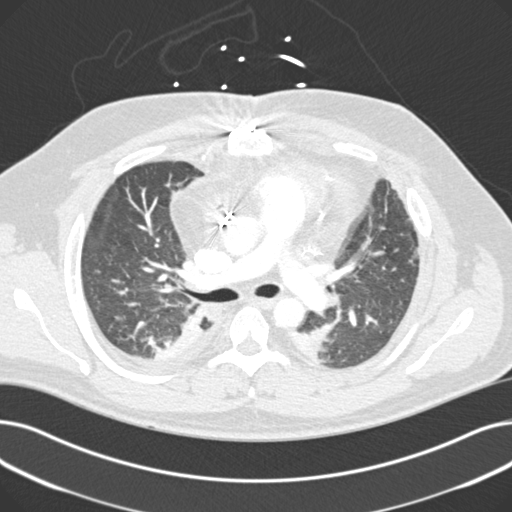
[im 214/297  mediastinal]
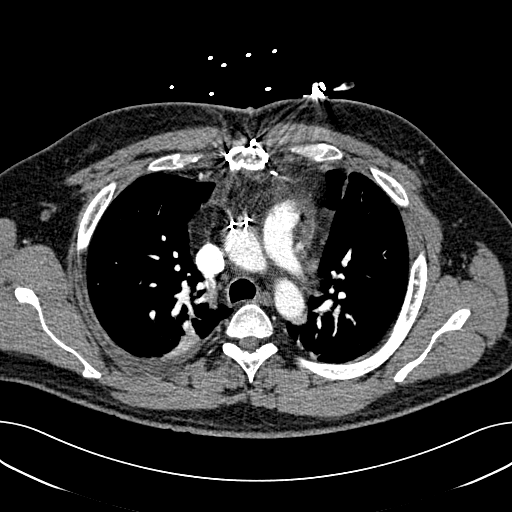
[im 231/297  lung]
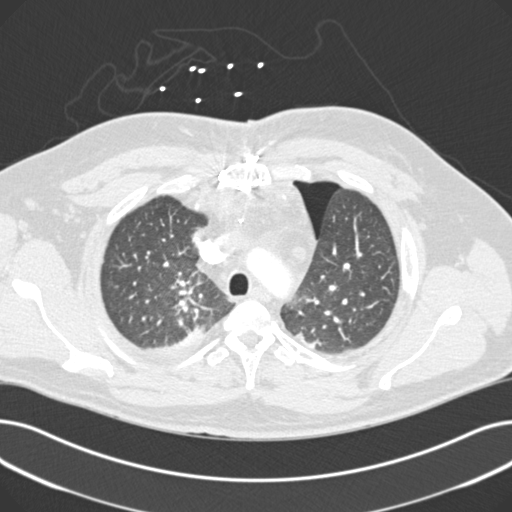
[im 247/297  mediastinal]
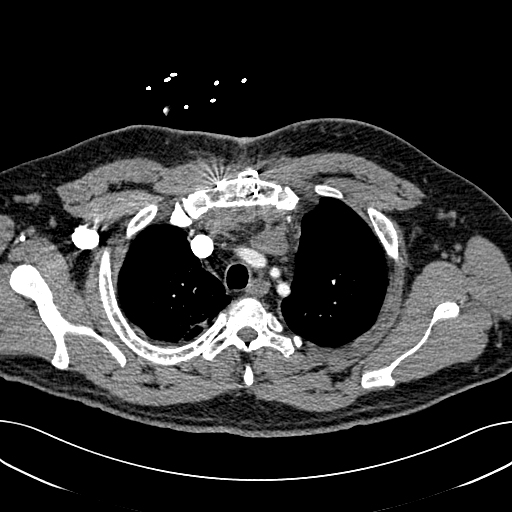
[im 264/297  lung]
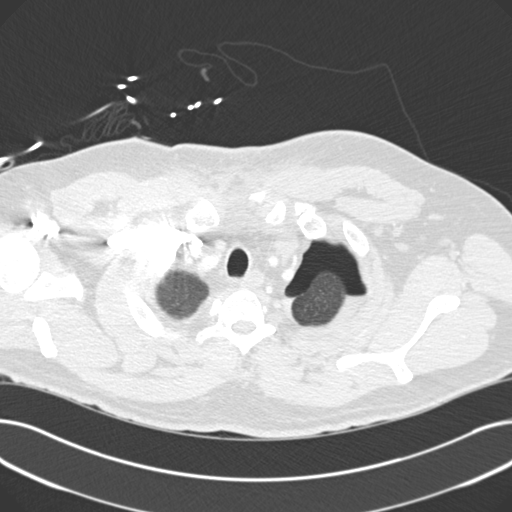
[im 280/297  mediastinal]
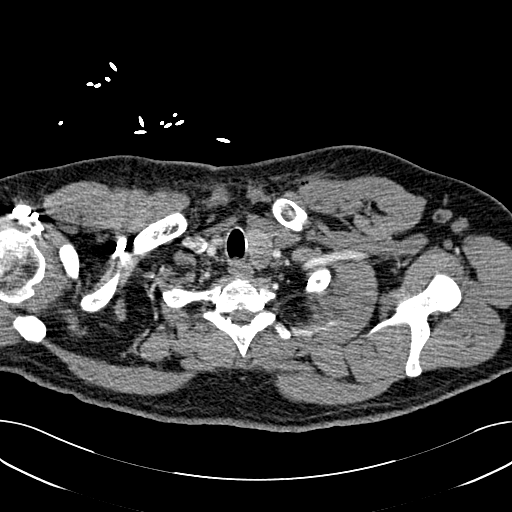

[18 of 30 positions shown; findings below may reference images not displayed]

FINDINGS: There is no demonstrable pulmonary embolus. There is no thoracic
aortic aneurysm or dissection. Visualized great vessels appear
normal.

There is a small apical pneumothorax on the left without tension
component, noted on chest radiograph obtained earlier today. There
are small pleural effusions bilaterally, slightly larger on the
right than on the left with bilateral lower lobe airspace
consolidation. There is mild atelectatic change in the posterior
segments of each upper lobe.

On axial slice 44 series 6, there is a 5 mm nodular opacity in the
anterior segment of the left upper lobe.

The visualized thyroid appears normal. There are small mediastinal
lymph nodes but no adenopathy by size criteria. There is a small
pericardial effusion. Soft tissue air in the lower anterior
mediastinum is likely secondary to recent median sternotomy.

In the visualized upper abdomen, no lesion is appreciable.

Patient is status post median sternotomy. There are no blastic or
lytic bone lesions.

Review of the MIP images confirms the above findings.
IMPRESSION: No demonstrable pulmonary embolus.

Small pneumothorax in the left apical region without tension
component.

Air in the lower anterior mediastinum is likely postoperative in
etiology. A small amount of air in the pericardial region likewise
is felt to be of postoperative etiology. If patient continues to
have clinical symptoms, a follow-up CT to assess for resolution of
these foci of air would certainly be warranted ; conceivably,
infectious lesion could present in this manner.

There is bibasilar airspace consolidation with small effusions. This
finding is likely due to compressive atelectasis given the recent
surgery, although a degree of superimposed pneumonia cannot be
excluded radiographically.

5 mm nodular opacity left upper lobe. Followup of this nodular
opacity should be based on [HOSPITAL] guidelines. If the
patient is at high risk for bronchogenic carcinoma, follow-up chest
CT at 6-12 months is recommended. If the patient is at low risk for
bronchogenic carcinoma, follow-up chest CT at 12 months is
recommended. This recommendation follows the consensus statement:
Guidelines for Management of Small Pulmonary Nodules Detected on CT
Scans: A Statement from the [HOSPITAL] as published in

Small mediastinal lymph nodes are likely of postoperative etiology.
No frank adenopathy by size criteria.

## 2017-12-10 DIAGNOSIS — G4733 Obstructive sleep apnea (adult) (pediatric): Secondary | ICD-10-CM | POA: Insufficient documentation

## 2019-06-12 DIAGNOSIS — I255 Ischemic cardiomyopathy: Secondary | ICD-10-CM | POA: Insufficient documentation

## 2019-06-14 DIAGNOSIS — E7800 Pure hypercholesterolemia, unspecified: Secondary | ICD-10-CM | POA: Insufficient documentation

## 2019-06-16 MED ORDER — Medication
1.00 | Status: DC
Start: ? — End: 2019-06-16

## 2019-06-16 MED ORDER — PANATUSS 5-2-15-100 MG/5ML OR SYRP
20.00 | ORAL_SOLUTION | ORAL | Status: DC
Start: 2019-06-15 — End: 2019-06-16

## 2019-06-16 MED ORDER — QUINERVA 260 MG PO TABS
650.00 | ORAL_TABLET | ORAL | Status: DC
Start: ? — End: 2019-06-16

## 2019-06-16 MED ORDER — TUSSI PRES-B 2-15-200 MG/5ML PO LIQD
0.50 | ORAL | Status: DC
Start: ? — End: 2019-06-16

## 2019-06-16 MED ORDER — RA COLD/COUGH DM 15-1-5 MG/5ML PO ELIX
80.00 | ORAL_SOLUTION | ORAL | Status: DC
Start: 2019-06-15 — End: 2019-06-16

## 2019-06-16 MED ORDER — PHENYLEPH-POT GUAIACOLSULF
81.00 | Status: DC
Start: 2019-06-15 — End: 2019-06-16

## 2019-06-16 MED ORDER — Medication
10.00 | Status: DC
Start: ? — End: 2019-06-16

## 2019-06-16 MED ORDER — DIOTAME PO
25.00 | ORAL | Status: DC
Start: 2019-06-14 — End: 2019-06-16

## 2019-06-17 ENCOUNTER — Encounter: Payer: BC Managed Care – PPO | Attending: Cardiovascular Disease | Admitting: *Deleted

## 2019-06-17 ENCOUNTER — Other Ambulatory Visit: Payer: Self-pay

## 2019-06-17 DIAGNOSIS — Z955 Presence of coronary angioplasty implant and graft: Secondary | ICD-10-CM | POA: Insufficient documentation

## 2019-06-17 DIAGNOSIS — I214 Non-ST elevation (NSTEMI) myocardial infarction: Secondary | ICD-10-CM

## 2019-06-17 DIAGNOSIS — Z79899 Other long term (current) drug therapy: Secondary | ICD-10-CM | POA: Insufficient documentation

## 2019-06-17 DIAGNOSIS — Z7982 Long term (current) use of aspirin: Secondary | ICD-10-CM | POA: Insufficient documentation

## 2019-06-17 DIAGNOSIS — E7800 Pure hypercholesterolemia, unspecified: Secondary | ICD-10-CM | POA: Insufficient documentation

## 2019-06-17 DIAGNOSIS — I2510 Atherosclerotic heart disease of native coronary artery without angina pectoris: Secondary | ICD-10-CM | POA: Insufficient documentation

## 2019-06-17 DIAGNOSIS — I252 Old myocardial infarction: Secondary | ICD-10-CM | POA: Insufficient documentation

## 2019-06-17 DIAGNOSIS — I509 Heart failure, unspecified: Secondary | ICD-10-CM | POA: Insufficient documentation

## 2019-06-17 DIAGNOSIS — Z87891 Personal history of nicotine dependence: Secondary | ICD-10-CM | POA: Insufficient documentation

## 2019-06-17 DIAGNOSIS — I110 Hypertensive heart disease with heart failure: Secondary | ICD-10-CM | POA: Insufficient documentation

## 2019-06-17 NOTE — Progress Notes (Signed)
Virtual Initial Orientation completed. Diagnosis can be found in CE 8/23. EP/RD Orienation scheduled for 8/31 at 9:30

## 2019-06-20 ENCOUNTER — Encounter: Payer: BC Managed Care – PPO | Admitting: *Deleted

## 2019-06-20 ENCOUNTER — Other Ambulatory Visit: Payer: Self-pay

## 2019-06-20 VITALS — Ht 65.9 in | Wt 218.1 lb

## 2019-06-20 DIAGNOSIS — I214 Non-ST elevation (NSTEMI) myocardial infarction: Secondary | ICD-10-CM | POA: Diagnosis not present

## 2019-06-20 DIAGNOSIS — I509 Heart failure, unspecified: Secondary | ICD-10-CM | POA: Diagnosis not present

## 2019-06-20 DIAGNOSIS — E7800 Pure hypercholesterolemia, unspecified: Secondary | ICD-10-CM | POA: Diagnosis not present

## 2019-06-20 DIAGNOSIS — Z955 Presence of coronary angioplasty implant and graft: Secondary | ICD-10-CM

## 2019-06-20 DIAGNOSIS — I2510 Atherosclerotic heart disease of native coronary artery without angina pectoris: Secondary | ICD-10-CM | POA: Diagnosis not present

## 2019-06-20 DIAGNOSIS — I252 Old myocardial infarction: Secondary | ICD-10-CM | POA: Diagnosis not present

## 2019-06-20 DIAGNOSIS — Z87891 Personal history of nicotine dependence: Secondary | ICD-10-CM | POA: Diagnosis not present

## 2019-06-20 DIAGNOSIS — Z79899 Other long term (current) drug therapy: Secondary | ICD-10-CM | POA: Diagnosis not present

## 2019-06-20 DIAGNOSIS — Z7982 Long term (current) use of aspirin: Secondary | ICD-10-CM | POA: Diagnosis not present

## 2019-06-20 DIAGNOSIS — I110 Hypertensive heart disease with heart failure: Secondary | ICD-10-CM | POA: Diagnosis not present

## 2019-06-20 NOTE — Patient Instructions (Signed)
Patient Instructions  Patient Details  Name: Gregg [name] MRN: [id number] Date of Birth: 03-Oct-1976 Referring Provider:  Cleaster Corin  Below are your personal goals for exercise, nutrition, and risk factors. Our goal is to help you stay on track towards obtaining and maintaining these goals. We will be discussing your progress on these goals with you throughout the program.  Initial Exercise Prescription: Initial Exercise Prescription - 06/20/19 1000      Date of Initial Exercise RX and Referring Provider   Date  06/20/19    Referring Provider  Leodis Sias MD      Treadmill   MPH  3    Grade  1    Minutes  15    METs  3.71      NuStep   Level  4    SPM  80    Minutes  15    METs  3      Recumbant Elliptical   Level  3    RPM  50    Minutes  15    METs  3      T5 Nustep   Level  4    SPM  80    Minutes  15    METs  3      Prescription Details   Frequency (times per week)  3    Duration  Progress to 30 minutes of continuous aerobic without signs/symptoms of physical distress      Intensity   THRR 40-80% of Max Heartrate  117-156    Ratings of Perceived Exertion  11-13    Perceived Dyspnea  0-4      Progression   Progression  Continue to progress workloads to maintain intensity without signs/symptoms of physical distress.      Resistance Training   Training Prescription  Yes    Weight  4 lbs    Reps  10-15       Exercise Goals: Frequency: Be able to perform aerobic exercise two to three times per week in program working toward 2-5 days per week of home exercise.  Intensity: Work with a perceived exertion of 11 (fairly light) - 15 (hard) while following your exercise prescription.  We will make changes to your prescription with you as you progress through the program.   Duration: Be able to do 30 to 45 minutes of continuous aerobic exercise in addition to a 5 minute warm-up and a 5 minute cool-down routine.   Nutrition Goals: Your personal nutrition  goals will be established when you do your nutrition analysis with the dietician.  The following are general nutrition guidelines to follow: Cholesterol < 200mg /day Sodium < 1500mg /day Fiber: Men under 50 yrs - 38 grams per day  Personal Goals: Personal Goals and Risk Factors at Admission - 06/20/19 1039      Core Components/Risk Factors/Patient Goals on Admission    Weight Management  Yes;Obesity;Weight Loss    Intervention  Weight Management: Develop a combined nutrition and exercise program designed to reach desired caloric intake, while maintaining appropriate intake of nutrient and fiber, sodium and fats, and appropriate energy expenditure required for the weight goal.;Weight Management: Provide education and appropriate resources to help participant work on and attain dietary goals.;Weight Management/Obesity: Establish reasonable short term and long term weight goals.;Obesity: Provide education and appropriate resources to help participant work on and attain dietary goals.    Admit Weight  218 lb 1.6 oz (98.9 kg)    Goal Weight: Short Term  210 lb (95.3 kg)    Goal Weight: Long Term  200 lb (90.7 kg)    Expected Outcomes  Short Term: Continue to assess and modify interventions until short term weight is achieved;Long Term: Adherence to nutrition and physical activity/exercise program aimed toward attainment of established weight goal;Weight Loss: Understanding of general recommendations for a balanced deficit meal plan, which promotes 1-2 lb weight loss per week and includes a negative energy balance of [phone] kcal/d;Understanding recommendations for meals to include 15-35% energy as protein, 25-35% energy from fat, 35-60% energy from carbohydrates, less than 200mg  of dietary cholesterol, 20-35 gm of total fiber daily;Understanding of distribution of calorie intake throughout the day with the consumption of 4-5 meals/snacks    Heart Failure  Yes    Intervention  Provide a combined exercise  and nutrition program that is supplemented with education, support and counseling about heart failure. Directed toward relieving symptoms such as shortness of breath, decreased exercise tolerance, and extremity edema.    Expected Outcomes  Improve functional capacity of life;Short term: Attendance in program 2-3 days a week with increased exercise capacity. Reported lower sodium intake. Reported increased fruit and vegetable intake. Reports medication compliance.;Short term: Daily weights obtained and reported for increase. Utilizing diuretic protocols set by physician.;Long term: Adoption of self-care skills and reduction of barriers for early signs and symptoms recognition and intervention leading to self-care maintenance.    Hypertension  Yes    Intervention  Provide education on lifestyle modifcations including regular physical activity/exercise, weight management, moderate sodium restriction and increased consumption of fresh fruit, vegetables, and low fat dairy, alcohol moderation, and smoking cessation.;Monitor prescription use compliance.    Expected Outcomes  Short Term: Continued assessment and intervention until BP is < 140/5690mm HG in hypertensive participants. < 130/8480mm HG in hypertensive participants with diabetes, heart failure or chronic kidney disease.;Long Term: Maintenance of blood pressure at goal levels.    Lipids  Yes    Intervention  Provide education and support for participant on nutrition & aerobic/resistive exercise along with prescribed medications to achieve LDL 70mg , HDL >40mg .    Expected Outcomes  Short Term: Participant states understanding of desired cholesterol values and is compliant with medications prescribed. Participant is following exercise prescription and nutrition guidelines.;Long Term: Cholesterol controlled with medications as prescribed, with individualized exercise RX and with personalized nutrition plan. Value goals: LDL < 70mg , HDL > 40 mg.       Tobacco  Use Initial Evaluation: Social History   Tobacco Use  Smoking Status Former Smoker  . Packs/day: 1.50  . Years: 20.00  . Pack years: 30.00  . Types: Cigarettes  . Quit date: 10/05/2015  . Years since quitting: 3.7  Smokeless Tobacco Never Used    Exercise Goals and Review: Exercise Goals    Row Name 06/20/19 1036             Exercise Goals   Increase Physical Activity  Yes       Intervention  Provide advice, education, support and counseling about physical activity/exercise needs.;Develop an individualized exercise prescription for aerobic and resistive training based on initial evaluation findings, risk stratification, comorbidities and participant's personal goals.       Expected Outcomes  Short Term: Attend rehab on a regular basis to increase amount of physical activity.;Long Term: Add in home exercise to make exercise part of routine and to increase amount of physical activity.;Long Term: Exercising regularly at least 3-5 days a week.       Increase  Strength and Stamina  Yes       Intervention  Provide advice, education, support and counseling about physical activity/exercise needs.;Develop an individualized exercise prescription for aerobic and resistive training based on initial evaluation findings, risk stratification, comorbidities and participant's personal goals.       Expected Outcomes  Short Term: Increase workloads from initial exercise prescription for resistance, speed, and METs.;Short Term: Perform resistance training exercises routinely during rehab and add in resistance training at home;Long Term: Improve cardiorespiratory fitness, muscular endurance and strength as measured by increased METs and functional capacity ( )       Able to understand and use rate of perceived exertion (RPE) scale  Yes       Intervention  Provide education and explanation on how to use RPE scale       Expected Outcomes  Short Term: Able to use RPE daily in rehab to express subjective  intensity level;Long Term:  Able to use RPE to guide intensity level when exercising independently       Able to understand and use Dyspnea scale  Yes       Intervention  Provide education and explanation on how to use Dyspnea scale       Expected Outcomes  Short Term: Able to use Dyspnea scale daily in rehab to express subjective sense of shortness of breath during exertion;Long Term: Able to use Dyspnea scale to guide intensity level when exercising independently       Knowledge and understanding of Target Heart Rate Range (THRR)  Yes       Intervention  Provide education and explanation of THRR including how the numbers were predicted and where they are located for reference       Expected Outcomes  Short Term: Able to state/look up THRR;Short Term: Able to use daily as guideline for intensity in rehab;Long Term: Able to use THRR to govern intensity when exercising independently       Able to check pulse independently  Yes       Intervention  Provide education and demonstration on how to check pulse in carotid and radial arteries.;Review the importance of being able to check your own pulse for safety during independent exercise       Expected Outcomes  Short Term: Able to explain why pulse checking is important during independent exercise;Long Term: Able to check pulse independently and accurately       Understanding of Exercise Prescription  Yes       Intervention  Provide education, explanation, and written materials on patient's individual exercise prescription       Expected Outcomes  Long Term: Able to explain home exercise prescription to exercise independently;Short Term: Able to explain program exercise prescription          Copy of goals given to participant.

## 2019-06-20 NOTE — Progress Notes (Signed)
Cardiac Individual Treatment Plan  Patient Details  Name: Gregg [name] MRN: [id number] Date of Birth: [birth year] Referring Provider:     Cardiac Rehab from 06/20/2019 in [facility] Cardiac and Pulmonary Rehab  Referring Provider  Leodis Sias MD      Initial Encounter Date:    Cardiac Rehab from 06/20/2019 in [facility] Cardiac and Pulmonary Rehab  Date  06/20/19      Visit Diagnosis: NSTEMI (non-ST elevated myocardial infarction) [facility])  Status post coronary artery stent placement  Patient's Home Medications on Admission:  Current Outpatient Medications:  .  amiodarone (PACERONE) 200 MG tablet, Take 1 tablet by mouth daily., Disp: , Rfl:  .  aspirin EC 81 MG tablet, Take 81 mg by mouth daily., Disp: , Rfl:  .  atorvastatin (LIPITOR) 80 MG tablet, Take 1 tablet by mouth daily., Disp: , Rfl:  .  Evolocumab (REPATHA) 140 MG/ML SOSY, Inject into the skin., Disp: , Rfl:  .  furosemide (LASIX) 20 MG tablet, Take 1 tablet by mouth daily., Disp: , Rfl:  .  gabapentin (NEURONTIN) 100 MG capsule, Take 1 capsule by mouth 3 (three) times daily., Disp: , Rfl:  .  lisinopril (ZESTRIL) 20 MG tablet, Take by mouth. , Disp: , Rfl:  .  metoprolol succinate (TOPROL-XL) 25 MG 24 hr tablet, Take 1 tablet by mouth daily. , Disp: , Rfl:  .  nitroGLYCERIN (NITROSTAT) 0.4 MG SL tablet, Place under the tongue., Disp: , Rfl:  .  oxyCODONE (OXY IR/ROXICODONE) 5 MG immediate release tablet, Take 1 tablet by mouth every 4 (four) hours as needed. , Disp: , Rfl:  .  prasugrel (EFFIENT) 10 MG TABS tablet, Take by mouth., Disp: , Rfl:   Past Medical History: Past Medical History:  Diagnosis Date  . CHF (congestive heart failure) ([place])   . Coronary artery disease   . High cholesterol   . Hypertension   . ST elevation (STEMI) myocardial infarction involving left anterior descending coronary artery [facility]) Jan 2016  . Tobacco use     Tobacco Use: Social History   Tobacco Use  Smoking Status Former Smoker  .  Packs/day: 1.50  . Years: 20.00  . Pack years: 30.00  . Types: Cigarettes  . Quit date: 10/05/2015  . Years since quitting: 3.7  Smokeless Tobacco Never Used    Labs: Recent Review Flowsheet Data    There is no flowsheet data to display.       Exercise Target Goals: Exercise Program Goal: Individual exercise prescription set using results from initial 6 min walk test and THRR while considering  patient's activity barriers and safety.   Exercise Prescription Goal: Initial exercise prescription builds to 30-45 minutes a day of aerobic activity, 2-3 days per week.  Home exercise guidelines will be given to patient during program as part of exercise prescription that the participant will acknowledge.  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 HR  117 bpm     Max Ex. BP  152/74     2 Minute Post BP  124/66        Oxygen Initial Assessment:   Oxygen Re-Evaluation:   Oxygen Discharge (Final Oxygen Re-Evaluation):   Initial Exercise Prescription: Initial Exercise Prescription - 06/20/19 1000      Date of Initial Exercise RX and Referring Provider   Date  06/20/19    Referring  Provider  Leodis Sias MD      Treadmill   MPH  3    Grade  1    Minutes  15    METs  3.71      NuStep   Level  4    SPM  80    Minutes  15    METs  3      Recumbant Elliptical   Level  3    RPM  50    Minutes  15    METs  3      T5 Nustep   Level  4    SPM  80    Minutes  15    METs  3      Prescription Details   Frequency (times per week)  3    Duration  Progress to 30 minutes of continuous aerobic without signs/symptoms of physical distress      Intensity   THRR 40-80% of Max Heartrate  117-156    Ratings of Perceived Exertion  11-13    Perceived Dyspnea  0-4      Progression   Progression  Continue to progress workloads to maintain intensity without signs/symptoms of physical distress.      Resistance Training   Training Prescription  Yes    Weight  4 lbs    Reps  10-15       Perform Capillary Blood Glucose checks as needed.  Exercise Prescription Changes: Exercise Prescription Changes    Row Name 06/20/19 1000             Response to Exercise   Blood Pressure (Admit)  122/64       Blood Pressure (Exercise)  152/74       Blood Pressure (Exit)  124/66       Heart Rate (Admit)  77 bpm       Heart Rate (Exercise)  117 bpm       Heart Rate (Exit)  84 bpm       Oxygen Saturation (Admit)  97 %       Oxygen Saturation (Exercise)  97 %       Rating of Perceived Exertion (Exercise)  11       Perceived Dyspnea (Exercise)  11       Symptoms  none  Comments  walk test results          Exercise Comments:   Exercise Goals and Review: Exercise Goals    Row Name 06/20/19 1036             Exercise Goals   Increase Physical Activity  Yes       Intervention  Provide advice, education, support and counseling about physical activity/exercise needs.;Develop an individualized exercise prescription for aerobic and resistive training based on initial evaluation findings, risk stratification, comorbidities and participant's personal goals.        Expected Outcomes  Short Term: Attend rehab on a regular basis to increase amount of physical activity.;Long Term: Add in home exercise to make exercise part of routine and to increase amount of physical activity.;Long Term: Exercising regularly at least 3-5 days a week.       Increase Strength and Stamina  Yes       Intervention  Provide advice, education, support and counseling about physical activity/exercise needs.;Develop an individualized exercise prescription for aerobic and resistive training based on initial evaluation findings, risk stratification, comorbidities and participant's personal goals.       Expected Outcomes  Short Term: Increase workloads from initial exercise prescription for resistance, speed, and METs.;Short Term: Perform resistance training exercises routinely during rehab and add in resistance training at home;Long Term: Improve cardiorespiratory fitness, muscular endurance and strength as measured by increased METs and functional capacity (6MWT)       Able to understand and use rate of perceived exertion (RPE) scale  Yes       Intervention  Provide education and explanation on how to use RPE scale       Expected Outcomes  Short Term: Able to use RPE daily in rehab to express subjective intensity level;Long Term:  Able to use RPE to guide intensity level when exercising independently       Able to understand and use Dyspnea scale  Yes       Intervention  Provide education and explanation on how to use Dyspnea scale       Expected Outcomes  Short Term: Able to use Dyspnea scale daily in rehab to express subjective sense of shortness of breath during exertion;Long Term: Able to use Dyspnea scale to guide intensity level when exercising independently       Knowledge and understanding of Target Heart Rate Range (THRR)  Yes       Intervention  Provide education and explanation of THRR including how the numbers were predicted and where they are located for reference       Expected  Outcomes  Short Term: Able to state/look up THRR;Short Term: Able to use daily as guideline for intensity in rehab;Long Term: Able to use THRR to govern intensity when exercising independently       Able to check pulse independently  Yes       Intervention  Provide education and demonstration on how to check pulse in carotid and radial arteries.;Review the importance of being able to check your own pulse for safety during independent exercise       Expected Outcomes  Short Term: Able to explain why pulse checking is important during independent exercise;Long Term: Able to check pulse independently and accurately       Understanding of Exercise Prescription  Yes       Intervention  Provide education, explanation, and written materials on patient's individual exercise prescription       Expected Outcomes  Long Term:  Able to explain home exercise prescription to exercise independently;Short Term: Able to explain program exercise prescription          Exercise Goals Re-Evaluation :   Discharge Exercise Prescription (Final Exercise Prescription Changes): Exercise Prescription Changes - 06/20/19 1000      Response to Exercise   Blood Pressure (Admit)  122/64    Blood Pressure (Exercise)  152/74    Blood Pressure (Exit)  124/66    Heart Rate (Admit)  77 bpm    Heart Rate (Exercise)  117 bpm    Heart Rate (Exit)  84 bpm    Oxygen Saturation (Admit)  97 %    Oxygen Saturation (Exercise)  97 %    Rating of Perceived Exertion (Exercise)  11    Perceived Dyspnea (Exercise)  11    Symptoms  none    Comments  walk test results       Nutrition:  Target Goals: Understanding of nutrition guidelines, daily intake of sodium '1500mg'$ , cholesterol '200mg'$ , calories 30% from fat and 7% or less from saturated fats, daily to have 5 or more servings of fruits and vegetables.  Biometrics: Pre Biometrics - 06/20/19 1036      Pre Biometrics   Height  5' 5.9" (1.674 m)    Weight  218 lb 1.6 oz (98.9 kg)     BMI (Calculated)  35.3    Single Leg Stand  30 seconds        Nutrition Therapy Plan and Nutrition Goals: Nutrition Therapy & Goals - 06/20/19 1042      Nutrition Therapy   Diet  low Na, HH diet    Drug/Food Interactions  Statins/Certain Fruits   lipitor   Protein (specify units)  80g    Fiber  30 grams    Whole Grain Foods  3 servings    Saturated Fats  12 max. grams    Fruits and Vegetables  5 servings/day    Sodium  1.5 grams      Personal Nutrition Goals   Nutrition Goal  ST: mechanical eating/ small frequent meals while appetite low LT: not worry about heart, be able to walk and not worry about heart    Comments  Pt reports that he stopped the keto diet because it was a fad diet and started it due to a recommendation by his cardiologist for weight loss; encouraged pt that overall heart healthy eating would work best for him in terms of health outcomes especially heart health outcomes and overall sustainability. Pt reports having a low appetitesince heart event and doesn't feel like eating, discussed mechanical eating and small frequent meals. Pt reports he drinks too much sweet tea and soda - discussed how to incorporate more water (pt gets bored with water). Pt reports not eating fried food and has limited salt intake at home. B (if he has it) is cornflakes with whole milk, L: whole wheat sandwich with [place], D: lean protein and vegetables (peas and corn or a salad). Pt reports eating cheeseat least 5x/week, uses mrs dash instead of salt as well as other spices and herbs, and uses egg beaters instead of whole eggs (wife uses whole eggs in cooking) Discussed General HH eating. 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      Intervention Plan   Intervention  Prescribe, educate and counsel regarding individualized specific  dietary modifications aiming towards targeted core components such as weight, hypertension, lipid management, diabetes, heart failure and other comorbidities.    Expected Outcomes  Short Term Goal: Understand basic principles of dietary content, such as calories, fat, sodium, cholesterol and nutrients.;Short Term Goal: A plan has been developed with personal nutrition goals set during dietitian appointment.;Long Term Goal: Adherence to prescribed nutrition plan.       Nutrition Assessments: Nutrition Assessments - 06/20/19 1042      Rate Your Plate Scores   Pre Score  35       Nutrition Goals Re-Evaluation:   Nutrition Goals Discharge (Final Nutrition Goals Re-Evaluation):   Psychosocial: Target Goals: Acknowledge presence or absence of significant depression and/or stress, maximize coping skills, provide positive support system. Participant is able to verbalize types and ability to use techniques and skills needed for reducing stress and depression.   Initial Review & Psychosocial Screening: Initial Psych Review & Screening - 06/17/19 1007      Initial Review   Current issues with  Current Stress Concerns    Source of Stress Concerns  Family;Occupation    Comments  Laron is currently out of work for about a month due to his NSTEMI. 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 He is staying home with his highschool aged and elementary aged daughters, so helping them with school. He is motivated to work hard and feels less stressed witih this heart attack than with his CABG in 2017.      Family Dynamics   Good Support System?  Yes      Barriers   Psychosocial barriers to participate in program  There are no identifiable barriers or psychosocial needs.      Screening Interventions   Interventions  Encouraged to exercise;To provide support and resources with identified psychosocial needs;Provide feedback about the scores to participant     Expected Outcomes  Short Term goal: Utilizing psychosocial counselor, staff and physician to assist with identification of specific Stressors or current issues interfering with healing process. Setting desired goal for each stressor or current issue identified.;Long Term Goal: Stressors or current issues are controlled or eliminated.;Short Term goal: Identification and review with participant of any Quality of Life or Depression concerns found by scoring the questionnaire.;Long Term goal: The participant improves quality of Life and PHQ9 Scores as seen by post scores and/or verbalization of changes       Quality of Life Scores:   Scores of 19 and below usually indicate a poorer quality of life in these areas.  A difference of  2-3 points is a clinically meaningful difference.  A difference of 2-3 points in the total score of the Quality of Life Index has been associated with significant improvement in overall quality of life, self-image, physical symptoms, and general health in studies assessing change in quality of life.  PHQ-9: Recent Review Flowsheet Data    Depression screen [facility] 2/9 06/20/2019 12/20/2015   Decreased Interest 0 0   Down, Depressed, Hopeless 0 0   PHQ - 2 Score 0 0   Altered sleeping 0 0   Tired, decreased energy 0 1   Change in appetite 3 2   Feeling bad or failure about yourself  0 0   Trouble concentrating 0 0   Moving slowly or fidgety/restless 0 0   Suicidal thoughts 0 0   PHQ-9 Score 3 3  Difficult doing work/chores Not difficult at all Not difficult at all     Interpretation of Total Score  Total Score Depression Severity:  1-4 = Minimal depression, 5-9 = Mild depression, 10-14 = Moderate depression, 15-19 = Moderately severe depression, 20-27 = Severe depression   Psychosocial Evaluation and Intervention:   Psychosocial Re-Evaluation:   Psychosocial Discharge (Final Psychosocial Re-Evaluation):   Vocational Rehabilitation: Provide vocational rehab  assistance to qualifying candidates.   Vocational Rehab Evaluation & Intervention: Vocational Rehab - 06/17/19 1006      Initial Vocational Rehab Evaluation & Intervention   Assessment shows need for Vocational Rehabilitation  No       Education: Education Goals: Education classes will be provided on a variety of topics geared toward better understanding of heart health and risk factor modification. Participant will state understanding/return demonstration of topics presented as noted by education test scores.  Learning Barriers/Preferences: Learning Barriers/Preferences - 06/17/19 1006      Learning Barriers/Preferences   Learning Barriers  None    Learning Preferences  None       Education Topics:  AED/CPR: - Group verbal and written instruction with the use of models to demonstrate the basic use of the AED with the basic ABC's of resuscitation.   General Nutrition Guidelines/Fats and Fiber: -Group instruction provided by verbal, written material, models and posters to present the general guidelines for heart healthy nutrition. Gives an explanation and review of dietary fats and fiber.   Controlling Sodium/Reading Food Labels: -Group verbal and written material supporting the discussion of sodium use in heart healthy nutrition. Review and explanation with models, verbal and written materials for utilization of the food label.   Exercise Physiology & General Exercise Guidelines: - Group verbal and written instruction with models to review the exercise physiology of the cardiovascular system and associated critical values. Provides general exercise guidelines with specific guidelines to those with heart or lung disease.    Aerobic Exercise & Resistance Training: - Gives group verbal and written instruction on the various components of exercise. Focuses on aerobic and resistive training programs and the benefits of this training and how to safely progress through these  programs..   Flexibility, Balance, Mind/Body Relaxation: Provides group verbal/written instruction on the benefits of flexibility and balance training, including mind/body exercise modes such as yoga, pilates and tai chi.  Demonstration and skill practice provided.   Stress and Anxiety: - Provides group verbal and written instruction about the health risks of elevated stress and causes of high stress.  Discuss the correlation between heart/lung disease and anxiety and treatment options. Review healthy ways to manage with stress and anxiety.   Depression: - Provides group verbal and written instruction on the correlation between heart/lung disease and depressed mood, treatment options, and the stigmas associated with seeking treatment.   Anatomy & Physiology of the Heart: - Group verbal and written instruction and models provide basic cardiac anatomy and physiology, with the coronary electrical and arterial systems. Review of Valvular disease and Heart Failure   Cardiac Procedures: - Group verbal and written instruction to review commonly prescribed medications for heart disease. Reviews the medication, class of the drug, and side effects. Includes the steps to properly store meds and maintain the prescription regimen. (beta blockers and nitrates)   Cardiac Medications I: - Group verbal and written instruction to review commonly prescribed medications for heart disease. Reviews the medication, class of the drug, and side effects. Includes the steps to properly store meds and maintain the  prescription regimen.   Cardiac Medications II: -Group verbal and written instruction to review commonly prescribed medications for heart disease. Reviews the medication, class of the drug, and side effects. (all other drug classes)    Go Sex-Intimacy & Heart Disease, Get SMART - Goal Setting: - Group verbal and written instruction through game format to discuss heart disease and the return to sexual  intimacy. Provides group verbal and written material to discuss and apply goal setting through the application of the S.M.A.R.T. Method.   Other Matters of the Heart: - Provides group verbal, written materials and models to describe Stable Angina and Peripheral Artery. Includes description of the disease process and treatment options available to the cardiac patient.   Exercise & Equipment Safety: - Individual verbal instruction and demonstration of equipment use and safety with use of the equipment.   Cardiac Rehab from 06/20/2019 in [facility] Cardiac and Pulmonary Rehab  Date  06/20/19  Educator  [facility]   Instruction Review Code  1- Verbalizes Understanding      Infection Prevention: - Provides verbal and written material to individual with discussion of infection control including proper hand washing and proper equipment cleaning during exercise session.   Cardiac Rehab from 06/20/2019 in [facility] Cardiac and Pulmonary Rehab  Date  06/20/19  Educator  [facility]  Instruction Review Code  1- Verbalizes Understanding      Falls Prevention: - Provides verbal and written material to individual with discussion of falls prevention and safety.   Cardiac Rehab from 06/20/2019 in [facility] Cardiac and Pulmonary Rehab  Date  06/20/19  Educator  [facility]  Instruction Review Code  1- Verbalizes Understanding      Diabetes: - Individual verbal and written instruction to review signs/symptoms of diabetes, desired ranges of glucose level fasting, after meals and with exercise. Acknowledge that pre and post exercise glucose checks will be done for 3 sessions at entry of program.   Know Your Numbers and Risk Factors: -Group verbal and written instruction about important numbers in your health.  Discussion of what are risk factors and how they play a role in the disease process.  Review of Cholesterol, Blood Pressure, Diabetes, and BMI and the role they play in your overall health.   Sleep Hygiene: -Provides group verbal  and written instruction about how sleep can affect your health.  Define sleep hygiene, discuss sleep cycles and impact of sleep habits. Review good sleep hygiene tips.    Other: -Provides group and verbal instruction on various topics (see comments)   Knowledge Questionnaire Score: Knowledge Questionnaire Score - 06/20/19 1048      Knowledge Questionnaire Score   Pre Score  21/26 Education Focus: MI, Heart Failure, PAD, Nutrition, Exercise       Core Components/Risk Factors/Patient Goals at Admission: Personal Goals and Risk Factors at Admission - 06/20/19 1039      Core Components/Risk Factors/Patient Goals on Admission    Weight Management  Yes;Obesity;Weight Loss    Intervention  Weight Management: Develop a combined nutrition and exercise program designed to reach desired caloric intake, while maintaining appropriate intake of nutrient and fiber, sodium and fats, and appropriate energy expenditure required for the weight goal.;Weight Management: Provide education and appropriate resources to help participant work on and attain dietary goals.;Weight Management/Obesity: Establish reasonable short term and long term weight goals.;Obesity: Provide education and appropriate resources to help participant work on and attain dietary goals.    Admit Weight  218 lb 1.6 oz (98.9 kg)    Goal  Weight: Short Term  210 lb (95.3 kg)    Goal Weight: Long Term  200 lb (90.7 kg)    Expected Outcomes  Short Term: Continue to assess and modify interventions until short term weight is achieved;Long Term: Adherence to nutrition and physical activity/exercise program aimed toward attainment of established weight goal;Weight Loss: Understanding of general recommendations for a balanced deficit meal plan, which promotes 1-2 lb weight loss per week and includes a negative energy balance of [phone] kcal/d;Understanding recommendations for meals to include 15-35% energy as protein, 25-35% energy from fat, 35-60%  energy from carbohydrates, less than '200mg'$  of dietary cholesterol, 20-35 gm of total fiber daily;Understanding of distribution of calorie intake throughout the day with the consumption of 4-5 meals/snacks    Heart Failure  Yes    Intervention  Provide a combined exercise and nutrition program that is supplemented with education, support and counseling about heart failure. Directed toward relieving symptoms such as shortness of breath, decreased exercise tolerance, and extremity edema.    Expected Outcomes  Improve functional capacity of life;Short term: Attendance in program 2-3 days a week with increased exercise capacity. Reported lower sodium intake. Reported increased fruit and vegetable intake. Reports medication compliance.;Short term: Daily weights obtained and reported for increase. Utilizing diuretic protocols set by physician.;Long term: Adoption of self-care skills and reduction of barriers for early signs and symptoms recognition and intervention leading to self-care maintenance.    Hypertension  Yes    Intervention  Provide education on lifestyle modifcations including regular physical activity/exercise, weight management, moderate sodium restriction and increased consumption of fresh fruit, vegetables, and low fat dairy, alcohol moderation, and smoking cessation.;Monitor prescription use compliance.    Expected Outcomes  Short Term: Continued assessment and intervention until BP is < 140/22m HG in hypertensive participants. < 130/869mHG in hypertensive participants with diabetes, heart failure or chronic kidney disease.;Long Term: Maintenance of blood pressure at goal levels.    Lipids  Yes    Intervention  Provide education and support for participant on nutrition & aerobic/resistive exercise along with prescribed medications to achieve LDL '70mg'$ , HDL >'40mg'$ .    Expected Outcomes  Short Term: Participant states understanding of desired cholesterol values and is compliant with medications  prescribed. Participant is following exercise prescription and nutrition guidelines.;Long Term: Cholesterol controlled with medications as prescribed, with individualized exercise RX and with personalized nutrition plan. Value goals: LDL < '70mg'$ , HDL > 40 mg.       Core Components/Risk Factors/Patient Goals Review:    Core Components/Risk Factors/Patient Goals at Discharge (Final Review):    ITP Comments: ITP Comments    Row Name 06/17/19 1018 06/20/19 1029         ITP Comments  Virtual Initial Orientation completed. Diagnosis can be found in CE 8/23. EP/RD Orienation scheduled for 8/31 at 9:30  Completed 6MWT, gym orientation, and RD evaluation. Initial ITP created and sent for review to Dr. MaEmily FilbertMedical Director.         Comments: Initial ITP

## 2019-06-22 ENCOUNTER — Encounter: Payer: BC Managed Care – PPO | Attending: Cardiovascular Disease | Admitting: *Deleted

## 2019-06-22 ENCOUNTER — Other Ambulatory Visit: Payer: Self-pay

## 2019-06-22 DIAGNOSIS — I110 Hypertensive heart disease with heart failure: Secondary | ICD-10-CM | POA: Diagnosis not present

## 2019-06-22 DIAGNOSIS — E7800 Pure hypercholesterolemia, unspecified: Secondary | ICD-10-CM | POA: Diagnosis not present

## 2019-06-22 DIAGNOSIS — Z87891 Personal history of nicotine dependence: Secondary | ICD-10-CM | POA: Diagnosis not present

## 2019-06-22 DIAGNOSIS — I2510 Atherosclerotic heart disease of native coronary artery without angina pectoris: Secondary | ICD-10-CM | POA: Diagnosis not present

## 2019-06-22 DIAGNOSIS — Z79899 Other long term (current) drug therapy: Secondary | ICD-10-CM | POA: Insufficient documentation

## 2019-06-22 DIAGNOSIS — Z955 Presence of coronary angioplasty implant and graft: Secondary | ICD-10-CM | POA: Diagnosis not present

## 2019-06-22 DIAGNOSIS — I252 Old myocardial infarction: Secondary | ICD-10-CM | POA: Diagnosis not present

## 2019-06-22 DIAGNOSIS — I509 Heart failure, unspecified: Secondary | ICD-10-CM | POA: Diagnosis not present

## 2019-06-22 DIAGNOSIS — Z7982 Long term (current) use of aspirin: Secondary | ICD-10-CM | POA: Diagnosis not present

## 2019-06-22 DIAGNOSIS — I214 Non-ST elevation (NSTEMI) myocardial infarction: Secondary | ICD-10-CM | POA: Diagnosis not present

## 2019-06-22 NOTE — Progress Notes (Signed)
Daily Session Note  Patient Details  Name: CHAEL URENDA MRN: [id number] Date of Birth: 1976-08-14 Referring Provider:     Cardiac Rehab from 06/20/2019 in [facility]  Referring Provider  Leodis Sias MD      Encounter Date: 06/22/2019  Check In: Session Check In - 06/22/19 1704      Check-In   Supervising physician immediately available to respond to emergencies  See telemetry face sheet for immediately available ER MD    Location  [facility]    Staff Present  Renita Papa, RN Vickki Hearing, BA, ACSM CEP, Exercise Physiologist;Melissa Caiola RDN, LDN    Virtual Visit  No    Medication changes reported      No    Fall or balance concerns reported     No    Warm-up and Cool-down  Performed on first and last piece of equipment    Resistance Training Performed  Yes    VAD Patient?  No    PAD/SET Patient?  No      Pain Assessment   Currently in Pain?  No/denies          Social History   Tobacco Use  Smoking Status Former Smoker  . Packs/day: 1.50  . Years: 20.00  . Pack years: 30.00  . Types: Cigarettes  . Quit date: 10/05/2015  . Years since quitting: 3.7  Smokeless Tobacco Never Used    Goals Met:  Independence with exercise equipment Exercise tolerated well No report of cardiac concerns or symptoms Strength training completed today  Goals Unmet:  Not Applicable  Comments: First full day of exercise!  Patient was oriented to gym and equipment including functions, settings, policies, and procedures.  Patient's individual exercise prescription and treatment plan were reviewed.  All starting workloads were established based on the results of the 6 minute walk test done at initial orientation visit.  The plan for exercise progression was also introduced and progression will be customized based on patient's performance and goals.    Dr. Emily Filbert is Medical Director for [place] and  [facility].

## 2019-06-23 ENCOUNTER — Encounter: Payer: BC Managed Care – PPO | Admitting: *Deleted

## 2019-06-23 DIAGNOSIS — Z955 Presence of coronary angioplasty implant and graft: Secondary | ICD-10-CM

## 2019-06-23 DIAGNOSIS — I214 Non-ST elevation (NSTEMI) myocardial infarction: Secondary | ICD-10-CM

## 2019-06-23 NOTE — Progress Notes (Signed)
Daily Session Note  Patient Details  Name: Gregg [name] MRN: [id number] Date of Birth: 12/21/1975 Referring Provider:     Cardiac Rehab from 06/20/2019 in [facility]  Referring Provider  Leodis Sias MD      Encounter Date: 06/23/2019  Check In: Session Check In - 06/23/19 1654      Check-In   Supervising physician immediately available to respond to emergencies  See telemetry face sheet for immediately available ER MD    Location  [facility]    Staff Present  Renita Papa, RN BSN;Jeanna Durrell BS, Exercise Physiologist;Joseph Tessie Fass RCP,RRT,BSRT    Virtual Visit  No    Medication changes reported      No    Fall or balance concerns reported     No    Warm-up and Cool-down  Performed on first and last piece of equipment    Resistance Training Performed  Yes    VAD Patient?  No      Pain Assessment   Currently in Pain?  No/denies          Social History   Tobacco Use  Smoking Status Former Smoker  . Packs/day: 1.50  . Years: 20.00  . Pack years: 30.00  . Types: Cigarettes  . Quit date: 10/05/2015  . Years since quitting: 3.7  Smokeless Tobacco Never Used    Goals Met:  Independence with exercise equipment Exercise tolerated well No report of cardiac concerns or symptoms Strength training completed today  Goals Unmet:  Not Applicable  Comments: Pt able to follow exercise prescription today without complaint.  Will continue to monitor for progression.    Dr. Emily Filbert is Medical Director for [place] and [facility].

## 2019-06-29 ENCOUNTER — Other Ambulatory Visit: Payer: Self-pay

## 2019-06-29 ENCOUNTER — Encounter: Payer: Self-pay | Admitting: *Deleted

## 2019-06-29 ENCOUNTER — Encounter: Payer: BC Managed Care – PPO | Admitting: *Deleted

## 2019-06-29 DIAGNOSIS — I214 Non-ST elevation (NSTEMI) myocardial infarction: Secondary | ICD-10-CM | POA: Diagnosis not present

## 2019-06-29 DIAGNOSIS — Z955 Presence of coronary angioplasty implant and graft: Secondary | ICD-10-CM

## 2019-06-29 NOTE — Progress Notes (Signed)
Cardiac Individual Treatment Plan  Patient Details  Name: Gregg [name] MRN: [id number] Date of Birth: [birth year] Referring Provider:     Cardiac Rehab from 06/20/2019 in [facility] Cardiac and Pulmonary Rehab  Referring Provider  Leodis Sias MD      Initial Encounter Date:    Cardiac Rehab from 06/20/2019 in [facility] Cardiac and Pulmonary Rehab  Date  06/20/19      Visit Diagnosis: NSTEMI (non-ST elevated myocardial infarction) [facility])  Status post coronary artery stent placement  Patient's Home Medications on Admission:  Current Outpatient Medications:  .  amiodarone (PACERONE) 200 MG tablet, Take 1 tablet by mouth daily., Disp: , Rfl:  .  aspirin EC 81 MG tablet, Take 81 mg by mouth daily., Disp: , Rfl:  .  atorvastatin (LIPITOR) 80 MG tablet, Take 1 tablet by mouth daily., Disp: , Rfl:  .  Evolocumab (REPATHA) 140 MG/ML SOSY, Inject into the skin., Disp: , Rfl:  .  furosemide (LASIX) 20 MG tablet, Take 1 tablet by mouth daily., Disp: , Rfl:  .  gabapentin (NEURONTIN) 100 MG capsule, Take 1 capsule by mouth 3 (three) times daily., Disp: , Rfl:  .  lisinopril (ZESTRIL) 20 MG tablet, Take by mouth. , Disp: , Rfl:  .  metoprolol succinate (TOPROL-XL) 25 MG 24 hr tablet, Take 1 tablet by mouth daily. , Disp: , Rfl:  .  nitroGLYCERIN (NITROSTAT) 0.4 MG SL tablet, Place under the tongue., Disp: , Rfl:  .  oxyCODONE (OXY IR/ROXICODONE) 5 MG immediate release tablet, Take 1 tablet by mouth every 4 (four) hours as needed. , Disp: , Rfl:  .  prasugrel (EFFIENT) 10 MG TABS tablet, Take by mouth., Disp: , Rfl:   Past Medical History: Past Medical History:  Diagnosis Date  . CHF (congestive heart failure) ([place])   . Coronary artery disease   . High cholesterol   . Hypertension   . ST elevation (STEMI) myocardial infarction involving left anterior descending coronary artery [facility]) Jan 2016  . Tobacco use     Tobacco Use: Social History   Tobacco Use  Smoking Status Former Smoker  .  Packs/day: 1.50  . Years: 20.00  . Pack years: 30.00  . Types: Cigarettes  . Quit date: 10/05/2015  . Years since quitting: 3.7  Smokeless Tobacco Never Used    Labs: Recent Review Flowsheet Data    There is no flowsheet data to display.       Exercise Target Goals: Exercise Program Goal: Individual exercise prescription set using results from initial 6 min walk test and THRR while considering  patient's activity barriers and safety.   Exercise Prescription Goal: Initial exercise prescription builds to 30-45 minutes a day of aerobic activity, 2-3 days per week.  Home exercise guidelines will be given to patient during program as part of exercise prescription that the participant will acknowledge.  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 HR  117 bpm     Max Ex. BP  152/74     2 Minute Post BP  124/66        Oxygen Initial Assessment:   Oxygen Re-Evaluation:   Oxygen Discharge (Final Oxygen Re-Evaluation):   Initial Exercise Prescription: Initial Exercise Prescription - 06/20/19 1000      Date of Initial Exercise RX and Referring Provider   Date  06/20/19    Referring  Provider  Leodis Sias MD      Treadmill   MPH  3    Grade  1    Minutes  15    METs  3.71      NuStep   Level  4    SPM  80    Minutes  15    METs  3      Recumbant Elliptical   Level  3    RPM  50    Minutes  15    METs  3      T5 Nustep   Level  4    SPM  80    Minutes  15    METs  3      Prescription Details   Frequency (times per week)  3    Duration  Progress to 30 minutes of continuous aerobic without signs/symptoms of physical distress      Intensity   THRR 40-80% of Max Heartrate  117-156    Ratings of Perceived Exertion  11-13    Perceived Dyspnea  0-4      Progression   Progression  Continue to progress workloads to maintain intensity without signs/symptoms of physical distress.      Resistance Training   Training Prescription  Yes    Weight  4 lbs    Reps  10-15       Perform Capillary Blood Glucose checks as needed.  Exercise Prescription Changes: Exercise Prescription Changes    Row Name 06/20/19 1000             Response to Exercise   Blood Pressure (Admit)  122/64       Blood Pressure (Exercise)  152/74       Blood Pressure (Exit)  124/66       Heart Rate (Admit)  77 bpm       Heart Rate (Exercise)  117 bpm       Heart Rate (Exit)  84 bpm       Oxygen Saturation (Admit)  97 %       Oxygen Saturation (Exercise)  97 %       Rating of Perceived Exertion (Exercise)  11       Perceived Dyspnea (Exercise)  11       Symptoms  none  Comments  walk test results          Exercise Comments: Exercise Comments    Row Name 06/22/19 1704           Exercise Comments  First full day of exercise!  Patient was oriented to gym and equipment including functions, settings, policies, and procedures.  Patient's individual exercise prescription and treatment plan were reviewed.  All starting workloads were established based on the results of the 6 minute walk test done at initial orientation visit.  The plan for exercise progression was also  introduced and progression will be customized based on patient's performance and goals.          Exercise Goals and Review: Exercise Goals    Row Name 06/20/19 1036             Exercise Goals   Increase Physical Activity  Yes       Intervention  Provide advice, education, support and counseling about physical activity/exercise needs.;Develop an individualized exercise prescription for aerobic and resistive training based on initial evaluation findings, risk stratification, comorbidities and participant's personal goals.       Expected Outcomes  Short Term: Attend rehab on a regular basis to increase amount of physical activity.;Long Term: Add in home exercise to make exercise part of routine and to increase amount of physical activity.;Long Term: Exercising regularly at least 3-5 days a week.       Increase Strength and Stamina  Yes       Intervention  Provide advice, education, support and counseling about physical activity/exercise needs.;Develop an individualized exercise prescription for aerobic and resistive training based on initial evaluation findings, risk stratification, comorbidities and participant's personal goals.       Expected Outcomes  Short Term: Increase workloads from initial exercise prescription for resistance, speed, and METs.;Short Term: Perform resistance training exercises routinely during rehab and add in resistance training at home;Long Term: Improve cardiorespiratory fitness, muscular endurance and strength as measured by increased METs and functional capacity (6MWT)       Able to understand and use rate of perceived exertion (RPE) scale  Yes       Intervention  Provide education and explanation on how to use RPE scale       Expected Outcomes  Short Term: Able to use RPE daily in rehab to express subjective intensity level;Long Term:  Able to use RPE to guide intensity level when exercising independently       Able to understand and use Dyspnea scale  Yes        Intervention  Provide education and explanation on how to use Dyspnea scale       Expected Outcomes  Short Term: Able to use Dyspnea scale daily in rehab to express subjective sense of shortness of breath during exertion;Long Term: Able to use Dyspnea scale to guide intensity level when exercising independently       Knowledge and understanding of Target Heart Rate Range (THRR)  Yes       Intervention  Provide education and explanation of THRR including how the numbers were predicted and where they are located for reference       Expected Outcomes  Short Term: Able to state/look up THRR;Short Term: Able to use daily as guideline for intensity in rehab;Long Term: Able to use THRR to govern intensity when exercising independently       Able to check pulse independently  Yes       Intervention  Provide education  and demonstration on how to check pulse in carotid and radial arteries.;Review the importance of being able to check your own pulse for safety during independent exercise       Expected Outcomes  Short Term: Able to explain why pulse checking is important during independent exercise;Long Term: Able to check pulse independently and accurately       Understanding of Exercise Prescription  Yes       Intervention  Provide education, explanation, and written materials on patient's individual exercise prescription       Expected Outcomes  Long Term: Able to explain home exercise prescription to exercise independently;Short Term: Able to explain program exercise prescription          Exercise Goals Re-Evaluation : Exercise Goals Re-Evaluation    Row Name 06/22/19 1707             Exercise Goal Re-Evaluation   Exercise Goals Review  Increase Physical Activity;Able to understand and use rate of perceived exertion (RPE) scale;Knowledge and understanding of Target Heart Rate Range (THRR);Understanding of Exercise Prescription;Increase Strength and Stamina;Able to check pulse independently        Comments  Reviewed RPE scale, THR and program prescription with pt today.  Pt voiced understanding and was given a copy of goals to take home.       Expected Outcomes  Short: Use RPE daily to regulate intensity. Long: Follow program prescription in THR.          Discharge Exercise Prescription (Final Exercise Prescription Changes): Exercise Prescription Changes - 06/20/19 1000      Response to Exercise   Blood Pressure (Admit)  122/64    Blood Pressure (Exercise)  152/74    Blood Pressure (Exit)  124/66    Heart Rate (Admit)  77 bpm    Heart Rate (Exercise)  117 bpm    Heart Rate (Exit)  84 bpm    Oxygen Saturation (Admit)  97 %    Oxygen Saturation (Exercise)  97 %    Rating of Perceived Exertion (Exercise)  11    Perceived Dyspnea (Exercise)  11    Symptoms  none    Comments  walk test results       Nutrition:  Target Goals: Understanding of nutrition guidelines, daily intake of sodium <1535m, cholesterol <2029m calories 30% from fat and 7% or less from saturated fats, daily to have 5 or more servings of fruits and vegetables.  Biometrics: Pre Biometrics - 06/20/19 1036      Pre Biometrics   Height  5' 5.9" (1.674 m)    Weight  218 lb 1.6 oz (98.9 kg)    BMI (Calculated)  35.3    Single Leg Stand  30 seconds        Nutrition Therapy Plan and Nutrition Goals: Nutrition Therapy & Goals - 06/20/19 1042      Nutrition Therapy   Diet  low Na, HH diet    Drug/Food Interactions  Statins/Certain Fruits   lipitor   Protein (specify units)  80g    Fiber  30 grams    Whole Grain Foods  3 servings    Saturated Fats  12 max. grams    Fruits and Vegetables  5 servings/day    Sodium  1.5 grams      Personal Nutrition Goals   Nutrition Goal  ST: mechanical eating/ small frequent meals while appetite low LT: not worry about heart, be able to walk and not worry about heart  Comments  Pt reports that he stopped the keto diet because it was a fad diet and started it due to a  recommendation by his cardiologist for weight loss; encouraged pt that overall heart healthy eating would work best for him in terms of health outcomes especially heart health outcomes and overall sustainability. Pt reports having a low appetitesince heart event and doesn't feel like eating, discussed mechanical eating and small frequent meals. Pt reports he drinks too much sweet tea and soda - discussed how to incorporate more water (pt gets bored with water). Pt reports not eating fried food and has limited salt intake at home. B (if he has it) is cornflakes with whole milk, L: whole wheat sandwich with [place], D: lean protein and vegetables (peas and corn or a salad). Pt reports eating cheeseat least 5x/week, uses mrs dash instead of salt as well as other spices and herbs, and uses egg beaters instead of whole eggs (wife uses whole eggs in cooking) Discussed General HH eating. 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      Intervention Plan   Intervention  Prescribe, educate and counsel regarding individualized specific dietary modifications aiming towards targeted core components such as weight, hypertension, lipid management, diabetes, heart failure and other comorbidities.    Expected Outcomes  Short Term Goal: Understand basic principles of dietary content, such as calories, fat, sodium, cholesterol and nutrients.;Short Term Goal: A plan has been developed with personal nutrition goals set during dietitian appointment.;Long Term Goal: Adherence to prescribed nutrition plan.       Nutrition Assessments: Nutrition Assessments - 06/20/19 1042      Rate Your Plate Scores   Pre Score  35       Nutrition Goals Re-Evaluation:   Nutrition Goals Discharge (Final Nutrition Goals Re-Evaluation):   Psychosocial: Target Goals: Acknowledge  presence or absence of significant depression and/or stress, maximize coping skills, provide positive support system. Participant is able to verbalize types and ability to use techniques and skills needed for reducing stress and depression.   Initial Review & Psychosocial Screening: Initial Psych Review & Screening - 06/17/19 1007      Initial Review   Current issues with  Current Stress Concerns    Source of Stress Concerns  Family;Occupation    Comments  Gregg [name] is currently out of work for about a month due to his NSTEMI. 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 He is staying home with his highschool aged and elementary aged daughters, so helping them with school. He is motivated to work hard and feels less stressed witih this heart attack than with his CABG in 2017.      Family Dynamics   Good Support System?  Yes      Barriers   Psychosocial barriers to participate in program  There are no identifiable barriers or psychosocial needs.      Screening Interventions   Interventions  Encouraged to exercise;To provide support and resources with identified psychosocial needs;Provide feedback about the scores to participant    Expected Outcomes  Short Term goal: Utilizing psychosocial counselor, staff and physician to assist with identification of specific Stressors or current issues interfering with healing process. Setting desired goal for each stressor or current issue identified.;Long Term Goal:  Stressors or current issues are controlled or eliminated.;Short Term goal: Identification and review with participant of any Quality of Life or Depression concerns found by scoring the questionnaire.;Long Term goal: The participant improves quality of Life and PHQ9 Scores as seen by post scores and/or verbalization of changes       Quality of Life Scores:   Scores of 19 and below usually indicate a poorer quality of life in these areas.  A difference of  2-3  points is a clinically meaningful difference.  A difference of 2-3 points in the total score of the Quality of Life Index has been associated with significant improvement in overall quality of life, self-image, physical symptoms, and general health in studies assessing change in quality of life.  PHQ-9: Recent Review Flowsheet Data    Depression screen [facility] 2/9 06/20/2019 12/20/2015   Decreased Interest 0 0   Down, Depressed, Hopeless 0 0   PHQ - 2 Score 0 0   Altered sleeping 0 0   Tired, decreased energy 0 1   Change in appetite 3 2   Feeling bad or failure about yourself  0 0   Trouble concentrating 0 0   Moving slowly or fidgety/restless 0 0   Suicidal thoughts 0 0   PHQ-9 Score 3 3   Difficult doing work/chores Not difficult at all Not difficult at all     Interpretation of Total Score  Total Score Depression Severity:  1-4 = Minimal depression, 5-9 = Mild depression, 10-14 = Moderate depression, 15-19 = Moderately severe depression, 20-27 = Severe depression   Psychosocial Evaluation and Intervention:   Psychosocial Re-Evaluation:   Psychosocial Discharge (Final Psychosocial Re-Evaluation):   Vocational Rehabilitation: Provide vocational rehab assistance to qualifying candidates.   Vocational Rehab Evaluation & Intervention: Vocational Rehab - 06/17/19 1006      Initial Vocational Rehab Evaluation & Intervention   Assessment shows need for Vocational Rehabilitation  No       Education: Education Goals: Education classes will be provided on a variety of topics geared toward better understanding of heart health and risk factor modification. Participant will state understanding/return demonstration of topics presented as noted by education test scores.  Learning Barriers/Preferences: Learning Barriers/Preferences - 06/17/19 1006      Learning Barriers/Preferences   Learning Barriers  None    Learning Preferences  None       Education Topics:  AED/CPR: - Group  verbal and written instruction with the use of models to demonstrate the basic use of the AED with the basic ABC's of resuscitation.   General Nutrition Guidelines/Fats and Fiber: -Group instruction provided by verbal, written material, models and posters to present the general guidelines for heart healthy nutrition. Gives an explanation and review of dietary fats and fiber.   Controlling Sodium/Reading Food Labels: -Group verbal and written material supporting the discussion of sodium use in heart healthy nutrition. Review and explanation with models, verbal and written materials for utilization of the food label.   Exercise Physiology & General Exercise Guidelines: - Group verbal and written instruction with models to review the exercise physiology of the cardiovascular system and associated critical values. Provides general exercise guidelines with specific guidelines to those with heart or lung disease.    Aerobic Exercise & Resistance Training: - Gives group verbal and written instruction on the various components of exercise. Focuses on aerobic and resistive training programs and the benefits of this training and how to safely progress through these programs..   Flexibility, Balance,  Mind/Body Relaxation: Provides group verbal/written instruction on the benefits of flexibility and balance training, including mind/body exercise modes such as yoga, pilates and tai chi.  Demonstration and skill practice provided.   Stress and Anxiety: - Provides group verbal and written instruction about the health risks of elevated stress and causes of high stress.  Discuss the correlation between heart/lung disease and anxiety and treatment options. Review healthy ways to manage with stress and anxiety.   Depression: - Provides group verbal and written instruction on the correlation between heart/lung disease and depressed mood, treatment options, and the stigmas associated with seeking  treatment.   Anatomy & Physiology of the Heart: - Group verbal and written instruction and models provide basic cardiac anatomy and physiology, with the coronary electrical and arterial systems. Review of Valvular disease and Heart Failure   Cardiac Procedures: - Group verbal and written instruction to review commonly prescribed medications for heart disease. Reviews the medication, class of the drug, and side effects. Includes the steps to properly store meds and maintain the prescription regimen. (beta blockers and nitrates)   Cardiac Medications I: - Group verbal and written instruction to review commonly prescribed medications for heart disease. Reviews the medication, class of the drug, and side effects. Includes the steps to properly store meds and maintain the prescription regimen.   Cardiac Medications II: -Group verbal and written instruction to review commonly prescribed medications for heart disease. Reviews the medication, class of the drug, and side effects. (all other drug classes)    Go Sex-Intimacy & Heart Disease, Get SMART - Goal Setting: - Group verbal and written instruction through game format to discuss heart disease and the return to sexual intimacy. Provides group verbal and written material to discuss and apply goal setting through the application of the S.M.A.R.T. Method.   Other Matters of the Heart: - Provides group verbal, written materials and models to describe Stable Angina and Peripheral Artery. Includes description of the disease process and treatment options available to the cardiac patient.   Exercise & Equipment Safety: - Individual verbal instruction and demonstration of equipment use and safety with use of the equipment.   Cardiac Rehab from 06/20/2019 in [facility] Cardiac and Pulmonary Rehab  Date  06/20/19  Educator  [facility]   Instruction Review Code  1- Verbalizes Understanding      Infection Prevention: - Provides verbal and written material to  individual with discussion of infection control including proper hand washing and proper equipment cleaning during exercise session.   Cardiac Rehab from 06/20/2019 in [facility] Cardiac and Pulmonary Rehab  Date  06/20/19  Educator  [facility]  Instruction Review Code  1- Verbalizes Understanding      Falls Prevention: - Provides verbal and written material to individual with discussion of falls prevention and safety.   Cardiac Rehab from 06/20/2019 in [facility] Cardiac and Pulmonary Rehab  Date  06/20/19  Educator  [facility]  Instruction Review Code  1- Verbalizes Understanding      Diabetes: - Individual verbal and written instruction to review signs/symptoms of diabetes, desired ranges of glucose level fasting, after meals and with exercise. Acknowledge that pre and post exercise glucose checks will be done for 3 sessions at entry of program.   Know Your Numbers and Risk Factors: -Group verbal and written instruction about important numbers in your health.  Discussion of what are risk factors and how they play a role in the disease process.  Review of Cholesterol, Blood Pressure, Diabetes, and BMI and the role  they play in your overall health.   Sleep Hygiene: -Provides group verbal and written instruction about how sleep can affect your health.  Define sleep hygiene, discuss sleep cycles and impact of sleep habits. Review good sleep hygiene tips.    Other: -Provides group and verbal instruction on various topics (see comments)   Knowledge Questionnaire Score: Knowledge Questionnaire Score - 06/20/19 1048      Knowledge Questionnaire Score   Pre Score  21/26 Education Focus: MI, Heart Failure, PAD, Nutrition, Exercise       Core Components/Risk Factors/Patient Goals at Admission: Personal Goals and Risk Factors at Admission - 06/20/19 1039      Core Components/Risk Factors/Patient Goals on Admission    Weight Management  Yes;Obesity;Weight Loss    Intervention  Weight Management: Develop a  combined nutrition and exercise program designed to reach desired caloric intake, while maintaining appropriate intake of nutrient and fiber, sodium and fats, and appropriate energy expenditure required for the weight goal.;Weight Management: Provide education and appropriate resources to help participant work on and attain dietary goals.;Weight Management/Obesity: Establish reasonable short term and long term weight goals.;Obesity: Provide education and appropriate resources to help participant work on and attain dietary goals.    Admit Weight  218 lb 1.6 oz (98.9 kg)    Goal Weight: Short Term  210 lb (95.3 kg)    Goal Weight: Long Term  200 lb (90.7 kg)    Expected Outcomes  Short Term: Continue to assess and modify interventions until short term weight is achieved;Long Term: Adherence to nutrition and physical activity/exercise program aimed toward attainment of established weight goal;Weight Loss: Understanding of general recommendations for a balanced deficit meal plan, which promotes 1-2 lb weight loss per week and includes a negative energy balance of [phone] kcal/d;Understanding recommendations for meals to include 15-35% energy as protein, 25-35% energy from fat, 35-60% energy from carbohydrates, less than 243m of dietary cholesterol, 20-35 gm of total fiber daily;Understanding of distribution of calorie intake throughout the day with the consumption of 4-5 meals/snacks    Heart Failure  Yes    Intervention  Provide a combined exercise and nutrition program that is supplemented with education, support and counseling about heart failure. Directed toward relieving symptoms such as shortness of breath, decreased exercise tolerance, and extremity edema.    Expected Outcomes  Improve functional capacity of life;Short term: Attendance in program 2-3 days a week with increased exercise capacity. Reported lower sodium intake. Reported increased fruit and vegetable intake. Reports medication  compliance.;Short term: Daily weights obtained and reported for increase. Utilizing diuretic protocols set by physician.;Long term: Adoption of self-care skills and reduction of barriers for early signs and symptoms recognition and intervention leading to self-care maintenance.    Hypertension  Yes    Intervention  Provide education on lifestyle modifcations including regular physical activity/exercise, weight management, moderate sodium restriction and increased consumption of fresh fruit, vegetables, and low fat dairy, alcohol moderation, and smoking cessation.;Monitor prescription use compliance.    Expected Outcomes  Short Term: Continued assessment and intervention until BP is < 140/931mHG in hypertensive participants. < 130/8062mG in hypertensive participants with diabetes, heart failure or chronic kidney disease.;Long Term: Maintenance of blood pressure at goal levels.    Lipids  Yes    Intervention  Provide education and support for participant on nutrition & aerobic/resistive exercise along with prescribed medications to achieve LDL <38m88mDL >40mg48m Expected Outcomes  Short Term: Participant states understanding of desired cholesterol  values and is compliant with medications prescribed. Participant is following exercise prescription and nutrition guidelines.;Long Term: Cholesterol controlled with medications as prescribed, with individualized exercise RX and with personalized nutrition plan. Value goals: LDL < 6m, HDL > 40 mg.       Core Components/Risk Factors/Patient Goals Review:    Core Components/Risk Factors/Patient Goals at Discharge (Final Review):    ITP Comments: ITP Comments    Row Name 06/17/19 1018 06/20/19 1029 06/29/19 0618       ITP Comments  Virtual Initial Orientation completed. Diagnosis can be found in CE 8/23. EP/RD Orienation scheduled for 8/31 at 9:30  Completed 6MWT, gym orientation, and RD evaluation. Initial ITP created and sent for review to Dr. MEmily Filbert Medical Director.  30 Day review. Continue with ITP unless directed changes per Medical Director review.  New to program        Comments:

## 2019-06-29 NOTE — Progress Notes (Signed)
Daily Session Note  Patient Details  Name: Gregg [name] MRN: [id number] Date of Birth: [birth year] Referring Provider:     Cardiac Rehab from 06/20/2019 in [facility] Cardiac and Pulmonary Rehab  Referring Provider  Leodis Sias MD      Encounter Date: 06/29/2019  Check In: Session Check In - 06/29/19 1700      Check-In   Supervising physician immediately available to respond to emergencies  See telemetry face sheet for immediately available ER MD    Location  [facility]-Cardiac & Pulmonary Rehab    Staff Present  Renita Papa, RN Vickki Hearing, BA, ACSM CEP, Exercise Physiologist;Melissa Caiola RDN, LDN    Virtual Visit  No    Medication changes reported      No    Fall or balance concerns reported     No    Warm-up and Cool-down  Performed on first and last piece of equipment    Resistance Training Performed  Yes    VAD Patient?  No    PAD/SET Patient?  No      Pain Assessment   Currently in Pain?  No/denies          Social History   Tobacco Use  Smoking Status Former Smoker  . Packs/day: 1.50  . Years: 20.00  . Pack years: 30.00  . Types: Cigarettes  . Quit date: 10/05/2015  . Years since quitting: 3.7  Smokeless Tobacco Never Used    Goals Met:  Independence with exercise equipment Exercise tolerated well No report of cardiac concerns or symptoms Strength training completed today  Goals Unmet:  Not Applicable  Comments: Pt able to follow exercise prescription today without complaint.  Will continue to monitor for progression.    Dr. Emily Filbert is Medical Director for [facility] and [facility].

## 2019-06-30 ENCOUNTER — Encounter: Payer: BC Managed Care – PPO | Admitting: *Deleted

## 2019-06-30 DIAGNOSIS — I214 Non-ST elevation (NSTEMI) myocardial infarction: Secondary | ICD-10-CM | POA: Diagnosis not present

## 2019-06-30 DIAGNOSIS — Z955 Presence of coronary angioplasty implant and graft: Secondary | ICD-10-CM

## 2019-06-30 NOTE — Progress Notes (Signed)
Daily Session Note  Patient Details  Name: Gregg [name] MRN: [id number] Date of Birth: 1975-11-07 Referring Provider:     Cardiac Rehab from 06/20/2019 in [facility] Cardiac and Pulmonary Rehab  Referring Provider  Leodis Sias MD      Encounter Date: 06/30/2019  Check In: Session Check In - 06/30/19 1657      Check-In   Supervising physician immediately available to respond to emergencies  See telemetry face sheet for immediately available ER MD    Location  [facility]    Staff Present  Renita Papa, RN BSN;Jeanna Durrell BS, Exercise Physiologist;Joseph Tessie Fass RCP,RRT,BSRT    Virtual Visit  No    Medication changes reported      No    Fall or balance concerns reported     No    Warm-up and Cool-down  Performed on first and last piece of equipment    Resistance Training Performed  Yes    VAD Patient?  No    PAD/SET Patient?  No      Pain Assessment   Currently in Pain?  No/denies          Social History   Tobacco Use  Smoking Status Former Smoker  . Packs/day: 1.50  . Years: 20.00  . Pack years: 30.00  . Types: Cigarettes  . Quit date: 10/05/2015  . Years since quitting: 3.7  Smokeless Tobacco Never Used    Goals Met:  Independence with exercise equipment Exercise tolerated well No report of cardiac concerns or symptoms Strength training completed today  Goals Unmet:  Not Applicable  Comments: Pt able to follow exercise prescription today without complaint.  Will continue to monitor for progression.    Dr. Emily Filbert is Medical Director for [facility] and [facility].

## 2019-07-04 ENCOUNTER — Other Ambulatory Visit: Payer: Self-pay

## 2019-07-04 ENCOUNTER — Encounter: Payer: BC Managed Care – PPO | Admitting: *Deleted

## 2019-07-04 DIAGNOSIS — I214 Non-ST elevation (NSTEMI) myocardial infarction: Secondary | ICD-10-CM | POA: Diagnosis not present

## 2019-07-04 DIAGNOSIS — Z955 Presence of coronary angioplasty implant and graft: Secondary | ICD-10-CM

## 2019-07-04 NOTE — Progress Notes (Signed)
Daily Session Note  Patient Details  Name: Gregg [name] MRN: [id number] Date of Birth: 1976-02-21 Referring Provider:     Cardiac Rehab from 06/20/2019 in [facility] Cardiac and Pulmonary Rehab  Referring Provider  Leodis Sias MD      Encounter Date: 07/04/2019  Check In: Session Check In - 07/04/19 1701      Check-In   Supervising physician immediately available to respond to emergencies  See telemetry face sheet for immediately available ER MD    Location  [facility]    Staff Present  Renita Papa, RN Moises Blood, BS, ACSM CEP, Exercise Physiologist;Joseph Tessie Fass RCP,RRT,BSRT    Virtual Visit  No    Medication changes reported      No    Fall or balance concerns reported     No    Warm-up and Cool-down  Performed on first and last piece of equipment    Resistance Training Performed  Yes    VAD Patient?  No    PAD/SET Patient?  No      Pain Assessment   Currently in Pain?  No/denies          Social History   Tobacco Use  Smoking Status Former Smoker  . Packs/day: 1.50  . Years: 20.00  . Pack years: 30.00  . Types: Cigarettes  . Quit date: 10/05/2015  . Years since quitting: 3.7  Smokeless Tobacco Never Used    Goals Met:  Independence with exercise equipment Exercise tolerated well No report of cardiac concerns or symptoms Strength training completed today  Goals Unmet:  Not Applicable  Comments: Pt able to follow exercise prescription today without complaint.  Will continue to monitor for progression.    Dr. Emily Filbert is Medical Director for [place] and [facility].

## 2019-07-06 ENCOUNTER — Other Ambulatory Visit: Payer: Self-pay

## 2019-07-06 ENCOUNTER — Encounter: Payer: BC Managed Care – PPO | Admitting: *Deleted

## 2019-07-06 DIAGNOSIS — Z955 Presence of coronary angioplasty implant and graft: Secondary | ICD-10-CM

## 2019-07-06 DIAGNOSIS — I214 Non-ST elevation (NSTEMI) myocardial infarction: Secondary | ICD-10-CM | POA: Diagnosis not present

## 2019-07-06 NOTE — Progress Notes (Signed)
Daily Session Note  Patient Details  Name: Gregg [name] MRN: [id number] Date of Birth: 08-19-1976 Referring Provider:     Cardiac Rehab from 06/20/2019 in [facility] Cardiac and Pulmonary Rehab  Referring Provider  Leodis Sias MD      Encounter Date: 07/06/2019  Check In: Session Check In - 07/06/19 1658      Check-In   Supervising physician immediately available to respond to emergencies  See telemetry face sheet for immediately available ER MD    Location  [facility]    Staff Present  Renita Papa, RN Vickki Hearing, BA, ACSM CEP, Exercise Physiologist;Melissa Caiola RDN, LDN    Virtual Visit  No    Medication changes reported      No    Fall or balance concerns reported     No    Warm-up and Cool-down  Performed on first and last piece of equipment    Resistance Training Performed  Yes    VAD Patient?  No    PAD/SET Patient?  No      Pain Assessment   Currently in Pain?  No/denies          Social History   Tobacco Use  Smoking Status Former Smoker  . Packs/day: 1.50  . Years: 20.00  . Pack years: 30.00  . Types: Cigarettes  . Quit date: 10/05/2015  . Years since quitting: 3.7  Smokeless Tobacco Never Used    Goals Met:  Independence with exercise equipment Exercise tolerated well No report of cardiac concerns or symptoms Strength training completed today  Goals Unmet:  Not Applicable  Comments: Pt able to follow exercise prescription today without complaint.  Will continue to monitor for progression.    Dr. Emily Filbert is Medical Director for [place] and [facility].

## 2019-07-07 ENCOUNTER — Encounter: Payer: BC Managed Care – PPO | Admitting: *Deleted

## 2019-07-07 DIAGNOSIS — Z955 Presence of coronary angioplasty implant and graft: Secondary | ICD-10-CM

## 2019-07-07 DIAGNOSIS — I214 Non-ST elevation (NSTEMI) myocardial infarction: Secondary | ICD-10-CM | POA: Diagnosis not present

## 2019-07-07 NOTE — Progress Notes (Signed)
Daily Session Note  Patient Details  Name: Gregg [name] MRN: [id number] Date of Birth: August 22, 1976 Referring Provider:     Cardiac Rehab from 06/20/2019 in [facility] Cardiac and Pulmonary Rehab  Referring Provider  Leodis Sias MD      Encounter Date: 07/07/2019  Check In: Session Check In - 07/07/19 1702      Check-In   Supervising physician immediately available to respond to emergencies  See telemetry face sheet for immediately available ER MD    Location  [facility]iac & Pul[facility] Re[facility]    Staff Present  Renita Papa, RN Moises Blood, BS, ACSM CEP, Exercise Physiologist;Joseph Tessie Fass RCP,RRT,BSRT    Virtual Visit  No    Medication changes reported      No    Fall or balance concerns reported     No    Warm-up and Cool-down  Performed on first and last piece of equipment    Resistance Training Performed  Yes    VAD Patient?  No    PAD/SET Patient?  No      Pain Assessment   Currently in Pain?  No/denies          Social History   Tobacco Use  Smoking Status Former Smoker  . Packs/day: 1.50  . Years: 20.00  . Pack years: 30.00  . Types: Cigarettes  . Quit date: 10/05/2015  . Years since quitting: 3.7  Smokeless Tobacco Never Used    Goals Met:  Independence with exercise equipment Exercise tolerated well No report of cardiac concerns or symptoms Strength training completed today  Goals Unmet:  Not Applicable  Comments: Pt able to follow exercise prescription today without complaint.  Will continue to monitor for progression.    Dr. Emily Filbert is Medical Director for [place] and [facility].

## 2019-07-14 ENCOUNTER — Encounter: Payer: BC Managed Care – PPO | Admitting: *Deleted

## 2019-07-14 ENCOUNTER — Other Ambulatory Visit: Payer: Self-pay

## 2019-07-14 DIAGNOSIS — I214 Non-ST elevation (NSTEMI) myocardial infarction: Secondary | ICD-10-CM

## 2019-07-14 NOTE — Progress Notes (Signed)
Daily Session Note  Patient Details  Name: Gregg [name] MRN: [id number] Date of Birth: Mar 19, 1976 Referring Provider:     Cardiac Rehab from 06/20/2019 in [facility] Cardiac and Pulmonary Rehab  Referring Provider  Leodis Sias MD      Encounter Date: 07/14/2019  Check In: Session Check In - 07/14/19 1416      Check-In   Supervising physician immediately available to respond to emergencies  See telemetry face sheet for immediately available ER MD    Location  [facility] Pulmonary Re[facility]    Staff Present  Renita Papa, RN BSN;Jessica Luan Pulling, MA, RCEP, CCRP, CCET    Virtual Visit  No    Medication changes reported      No    Fall or balance concerns reported     No    Warm-up and Cool-down  Performed on first and last piece of equipment    Resistance Training Performed  Yes    VAD Patient?  No    PAD/SET Patient?  No      Pain Assessment   Currently in Pain?  No/denies          Social History   Tobacco Use  Smoking Status Former Smoker  . Packs/day: 1.50  . Years: 20.00  . Pack years: 30.00  . Types: Cigarettes  . Quit date: 10/05/2015  . Years since quitting: 3.7  Smokeless Tobacco Never Used    Goals Met:  Independence with exercise equipment Exercise tolerated well No report of cardiac concerns or symptoms Strength training completed today  Goals Unmet:  Not Applicable  Comments: Pt able to follow exercise prescription today without complaint.  Will continue to monitor for progression. Reviewed home exercise with pt today.  Pt plans to walk and use gym at work for exercise.  Reviewed THR, pulse, RPE, sign and symptoms, NTG use, and when to call 911 or MD.  Also discussed weather considerations and indoor options.  Pt voiced understanding.     Dr. Emily Filbert is Medical Director for [place] and [facility].

## 2019-07-18 ENCOUNTER — Other Ambulatory Visit: Payer: Self-pay

## 2019-07-18 ENCOUNTER — Encounter: Payer: BC Managed Care – PPO | Admitting: *Deleted

## 2019-07-18 DIAGNOSIS — I214 Non-ST elevation (NSTEMI) myocardial infarction: Secondary | ICD-10-CM

## 2019-07-18 DIAGNOSIS — Z955 Presence of coronary angioplasty implant and graft: Secondary | ICD-10-CM

## 2019-07-18 DIAGNOSIS — Z951 Presence of aortocoronary bypass graft: Secondary | ICD-10-CM

## 2019-07-18 NOTE — Progress Notes (Signed)
Daily Session Note  Patient Details  Name: Gregg [name] MRN: [id number] Date of Birth: 27-May-1976 Referring Provider:     Cardiac Rehab from 06/20/2019 in [facility] Cardiac and Pulmonary Rehab  Referring Provider  Leodis Sias MD      Encounter Date: 07/18/2019  Check In: Session Check In - 07/18/19 1411      Check-In   Supervising physician immediately available to respond to emergencies  See telemetry face sheet for immediately available ER MD    Location  [facility] Pulmonary Re[facility]    Staff Present  Earlean Shawl, BS, ACSM CEP, Exercise Physiologist;Joseph Hood RCP,RRT,BSRT;Carroll Enterkin, RN, BSN-BC, CCRP    Virtual Visit  No    Medication changes reported      No    Fall or balance concerns reported     No    Warm-up and Cool-down  Performed on first and last piece of equipment    Resistance Training Performed  Yes    VAD Patient?  No    PAD/SET Patient?  No      Pain Assessment   Currently in Pain?  No/denies    Multiple Pain Sites  No          Social History   Tobacco Use  Smoking Status Former Smoker  . Packs/day: 1.50  . Years: 20.00  . Pack years: 30.00  . Types: Cigarettes  . Quit date: 10/05/2015  . Years since quitting: 3.7  Smokeless Tobacco Never Used    Goals Met:  Independence with exercise equipment Exercise tolerated well No report of cardiac concerns or symptoms Strength training completed today  Goals Unmet:  Not Applicable  Comments: Pt able to follow exercise prescription today without complaint.  Will continue to monitor for progression.    Dr. Emily Filbert is Medical Director for [place] and [facility].

## 2019-07-21 ENCOUNTER — Telehealth: Payer: Self-pay

## 2019-07-21 ENCOUNTER — Encounter: Payer: BC Managed Care – PPO | Attending: Cardiovascular Disease

## 2019-07-21 DIAGNOSIS — I252 Old myocardial infarction: Secondary | ICD-10-CM | POA: Insufficient documentation

## 2019-07-21 DIAGNOSIS — I2510 Atherosclerotic heart disease of native coronary artery without angina pectoris: Secondary | ICD-10-CM | POA: Insufficient documentation

## 2019-07-21 DIAGNOSIS — I214 Non-ST elevation (NSTEMI) myocardial infarction: Secondary | ICD-10-CM | POA: Insufficient documentation

## 2019-07-21 DIAGNOSIS — Z7982 Long term (current) use of aspirin: Secondary | ICD-10-CM | POA: Insufficient documentation

## 2019-07-21 DIAGNOSIS — I509 Heart failure, unspecified: Secondary | ICD-10-CM | POA: Insufficient documentation

## 2019-07-21 DIAGNOSIS — E7800 Pure hypercholesterolemia, unspecified: Secondary | ICD-10-CM | POA: Insufficient documentation

## 2019-07-21 DIAGNOSIS — I110 Hypertensive heart disease with heart failure: Secondary | ICD-10-CM | POA: Insufficient documentation

## 2019-07-21 DIAGNOSIS — Z87891 Personal history of nicotine dependence: Secondary | ICD-10-CM | POA: Insufficient documentation

## 2019-07-21 DIAGNOSIS — Z955 Presence of coronary angioplasty implant and graft: Secondary | ICD-10-CM | POA: Insufficient documentation

## 2019-07-21 DIAGNOSIS — Z79899 Other long term (current) drug therapy: Secondary | ICD-10-CM | POA: Insufficient documentation

## 2019-07-21 NOTE — Telephone Encounter (Signed)
LMOM

## 2019-07-27 ENCOUNTER — Encounter: Payer: Self-pay | Admitting: *Deleted

## 2019-07-27 DIAGNOSIS — I214 Non-ST elevation (NSTEMI) myocardial infarction: Secondary | ICD-10-CM

## 2019-07-27 DIAGNOSIS — Z951 Presence of aortocoronary bypass graft: Secondary | ICD-10-CM

## 2019-07-27 DIAGNOSIS — Z955 Presence of coronary angioplasty implant and graft: Secondary | ICD-10-CM

## 2019-07-27 NOTE — Progress Notes (Signed)
Cardiac Individual Treatment Plan  Patient Details  Name: Gregg [name] MRN: [id number] Date of Birth: 09/12/1976 Referring Provider:     Cardiac Rehab from 06/20/2019 in [facility]  Referring Provider  Navar, Ann MD      Initial Encounter Date:    Cardiac Rehab from 06/20/2019 in [facility]  Date  06/20/19      Visit Diagnosis: NSTEMI (non-ST elevated myocardial infarction) (HCC)  Status post coronary artery stent placement  S/P CABG x 3  Patient's Home Medications on Admission:  Current Outpatient Medications:  .  amiodarone (PACERONE) 200 MG tablet, Take 1 tablet by mouth daily., Disp: , Rfl:  .  aspirin EC 81 MG tablet, Take 81 mg by mouth daily., Disp: , Rfl:  .  atorvastatin (LIPITOR) 80 MG tablet, Take 1 tablet by mouth daily., Disp: , Rfl:  .  Evolocumab (REPATHA) 140 MG/ML SOSY, Inject into the skin., Disp: , Rfl:  .  furosemide (LASIX) 20 MG tablet, Take 1 tablet by mouth daily., Disp: , Rfl:  .  gabapentin (NEURONTIN) 100 MG capsule, Take 1 capsule by mouth 3 (three) times daily., Disp: , Rfl:  .  lisinopril (ZESTRIL) 20 MG tablet, Take by mouth. , Disp: , Rfl:  .  metoprolol succinate (TOPROL-XL) 25 MG 24 hr tablet, Take 1 tablet by mouth daily. , Disp: , Rfl:  .  nitroGLYCERIN (NITROSTAT) 0.4 MG SL tablet, Place under the tongue., Disp: , Rfl:  .  oxyCODONE (OXY IR/ROXICODONE) 5 MG immediate release tablet, Take 1 tablet by mouth every 4 (four) hours as needed. , Disp: , Rfl:  .  prasugrel (EFFIENT) 10 MG TABS tablet, Take by mouth., Disp: , Rfl:   Past Medical History: Past Medical History:  Diagnosis Date  . CHF (congestive heart failure) (HCC)   . Coronary artery disease   . High cholesterol   . Hypertension   . ST elevation (STEMI) myocardial infarction involving left anterior descending coronary artery (HCC) Jan 2016  . Tobacco use     Tobacco Use: Social History   Tobacco Use  Smoking Status Former  Smoker  . Packs/day: 1.50  . Years: 20.00  . Pack years: 30.00  . Types: Cigarettes  . Quit date: 10/05/2015  . Years since quitting: 3.8  Smokeless Tobacco Never Used    Labs: Recent Review Flowsheet Data    There is no flowsheet data to display.       Exercise Target Goals: Exercise Program Goal: Individual exercise prescription set using results from initial 6 min walk test and THRR while considering  patient's activity barriers and safety.   Exercise Prescription Goal: Initial exercise prescription builds to 30-45 minutes a day of aerobic activity, 2-3 days per week.  Home exercise guidelines will be given to patient during program as part of exercise prescription that the participant will acknowledge.  Activity Barriers & Risk Stratification: Activity Barriers & Cardiac Risk Stratification - 06/20/19 1031      Activity Barriers & Cardiac Risk Stratification   Activity Barriers  Other (comment);Deconditioning;Shortness of Breath    Comments  slight swelling in left leg from CABG in 2017, calves burning with 6MWT    Cardiac Risk Stratification  High       6 Minute Walk: 6 Minute Walk    Row Name 06/20/19 1030         6 Minute Walk   Phase  Initial     Distance  1604 feet       Walk Time  6 minutes     # of Rest Breaks  0     MPH  3.04     METS  4.74     RPE  11     Perceived Dyspnea   1     VO2 Peak  16.58     Symptoms  Yes (comment)     Comments  calves burning 4-5/10, SOB     Resting HR  77 bpm     Resting BP  122/64     Resting Oxygen Saturation   97 %     Exercise Oxygen Saturation  during 6 min walk  97 %     Max Ex. HR  117 bpm     Max Ex. BP  152/74     2 Minute Post BP  124/66        Oxygen Initial Assessment:   Oxygen Re-Evaluation:   Oxygen Discharge (Final Oxygen Re-Evaluation):   Initial Exercise Prescription: Initial Exercise Prescription - 06/20/19 1000      Date of Initial Exercise RX and Referring Provider   Date  06/20/19     Referring Provider  Navar, Ann MD      Treadmill   MPH  3    Grade  1    Minutes  15    METs  3.71      NuStep   Level  4    SPM  80    Minutes  15    METs  3      Recumbant Elliptical   Level  3    RPM  50    Minutes  15    METs  3      T5 Nustep   Level  4    SPM  80    Minutes  15    METs  3      Prescription Details   Frequency (times per week)  3    Duration  Progress to 30 minutes of continuous aerobic without signs/symptoms of physical distress      Intensity   THRR 40-80% of Max Heartrate  117-156    Ratings of Perceived Exertion  11-13    Perceived Dyspnea  0-4      Progression   Progression  Continue to progress workloads to maintain intensity without signs/symptoms of physical distress.      Resistance Training   Training Prescription  Yes    Weight  4 lbs    Reps  10-15       Perform Capillary Blood Glucose checks as needed.  Exercise Prescription Changes: Exercise Prescription Changes    Row Name 06/20/19 1000 07/08/19 1200 07/20/19 1400         Response to Exercise   Blood Pressure (Admit)  122/64  138/70  108/68     Blood Pressure (Exercise)  152/74  160/80  170/80     Blood Pressure (Exit)  124/66  122/80  110/62     Heart Rate (Admit)  77 bpm  94 bpm  103 bpm     Heart Rate (Exercise)  117 bpm  120 bpm  126 bpm     Heart Rate (Exit)  84 bpm  95 bpm  103 bpm     Oxygen Saturation (Admit)  97 %  -  -     Oxygen Saturation (Exercise)  97 %  -  -     Rating of Perceived Exertion (Exercise)  11  -  -       Perceived Dyspnea (Exercise)  11  -  -     Symptoms  none  none  none     Comments  walk test results  -  -     Duration  -  Continue with 30 min of aerobic exercise without signs/symptoms of physical distress.  Continue with 30 min of aerobic exercise without signs/symptoms of physical distress.     Intensity  -  THRR unchanged  THRR unchanged       Progression   Progression  -  Continue to progress workloads to maintain intensity  without signs/symptoms of physical distress.  Continue to progress workloads to maintain intensity without signs/symptoms of physical distress.     Average METs  -  4.75  4.87       Resistance Training   Training Prescription  -  Yes  Yes     Weight  -  4 lb  4 lbs     Reps  -  10-15  10-15       Interval Training   Interval Training  -  -  No       Treadmill   MPH  -  3  3     Grade  -  1  1     Minutes  -  15  15     METs  -  3.71  3.71       NuStep   Level  -  5  5     SPM  -  80  -     Minutes  -  15  15     METs  -  6.5  6.5       Recumbant Elliptical   Level  -  3  3     RPM  -  50  -     Minutes  -  15  15     METs  -  3  4.4       T5 Nustep   Level  -  -  4     Minutes  -  -  15     METs  -  -  3.5       Home Exercise Plan   Plans to continue exercise at  -  -  Home (comment) walk, gym at work     Frequency  -  -  Add 3 additional days to program exercise sessions.     Initial Home Exercises Provided  -  -  07/12/19        Exercise Comments: Exercise Comments    Row Name 06/22/19 1704           Exercise Comments  First full day of exercise!  Patient was oriented to gym and equipment including functions, settings, policies, and procedures.  Patient's individual exercise prescription and treatment plan were reviewed.  All starting workloads were established based on the results of the 6 minute walk test done at initial orientation visit.  The plan for exercise progression was also introduced and progression will be customized based on patient's performance and goals.          Exercise Goals and Review: Exercise Goals    Row Name 06/20/19 1036             Exercise Goals   Increase Physical Activity  Yes       Intervention  Provide advice, education, support and counseling about physical activity/exercise needs.;Develop an individualized   exercise prescription for aerobic and resistive training based on initial evaluation findings, risk stratification,  comorbidities and participant's personal goals.       Expected Outcomes  Short Term: Attend rehab on a regular basis to increase amount of physical activity.;Long Term: Add in home exercise to make exercise part of routine and to increase amount of physical activity.;Long Term: Exercising regularly at least 3-5 days a week.       Increase Strength and Stamina  Yes       Intervention  Provide advice, education, support and counseling about physical activity/exercise needs.;Develop an individualized exercise prescription for aerobic and resistive training based on initial evaluation findings, risk stratification, comorbidities and participant's personal goals.       Expected Outcomes  Short Term: Increase workloads from initial exercise prescription for resistance, speed, and METs.;Short Term: Perform resistance training exercises routinely during rehab and add in resistance training at home;Long Term: Improve cardiorespiratory fitness, muscular endurance and strength as measured by increased METs and functional capacity (6MWT)       Able to understand and use rate of perceived exertion (RPE) scale  Yes       Intervention  Provide education and explanation on how to use RPE scale       Expected Outcomes  Short Term: Able to use RPE daily in rehab to express subjective intensity level;Long Term:  Able to use RPE to guide intensity level when exercising independently       Able to understand and use Dyspnea scale  Yes       Intervention  Provide education and explanation on how to use Dyspnea scale       Expected Outcomes  Short Term: Able to use Dyspnea scale daily in rehab to express subjective sense of shortness of breath during exertion;Long Term: Able to use Dyspnea scale to guide intensity level when exercising independently       Knowledge and understanding of Target Heart Rate Range (THRR)  Yes       Intervention  Provide education and explanation of THRR including how the numbers were predicted and  where they are located for reference       Expected Outcomes  Short Term: Able to state/look up THRR;Short Term: Able to use daily as guideline for intensity in rehab;Long Term: Able to use THRR to govern intensity when exercising independently       Able to check pulse independently  Yes       Intervention  Provide education and demonstration on how to check pulse in carotid and radial arteries.;Review the importance of being able to check your own pulse for safety during independent exercise       Expected Outcomes  Short Term: Able to explain why pulse checking is important during independent exercise;Long Term: Able to check pulse independently and accurately       Understanding of Exercise Prescription  Yes       Intervention  Provide education, explanation, and written materials on patient's individual exercise prescription       Expected Outcomes  Long Term: Able to explain home exercise prescription to exercise independently;Short Term: Able to explain program exercise prescription          Exercise Goals Re-Evaluation : Exercise Goals Re-Evaluation    Row Name 06/22/19 1707 07/08/19 1255 07/14/19 1436 07/19/19 1450       Exercise Goal Re-Evaluation   Exercise Goals Review  Increase Physical Activity;Able to understand and use rate of perceived exertion (RPE)  scale;Knowledge and understanding of Target Heart Rate Range (THRR);Understanding of Exercise Prescription;Increase Strength and Stamina;Able to check pulse independently  Increase Physical Activity;Increase Strength and Stamina;Able to understand and use rate of perceived exertion (RPE) scale;Knowledge and understanding of Target Heart Rate Range (THRR);Able to check pulse independently;Understanding of Exercise Prescription  Increase Physical Activity;Increase Strength and Stamina;Able to understand and use rate of perceived exertion (RPE) scale;Knowledge and understanding of Target Heart Rate Range (THRR);Able to check pulse  independently;Understanding of Exercise Prescription  Increase Physical Activity;Increase Strength and Stamina;Understanding of Exercise Prescription    Comments  Reviewed RPE scale, THR and program prescription with pt today.  Pt voiced understanding and was given a copy of goals to take home.  [name] is doing well in rehab.  He should be able to progress quickly.  Staff will monitor progress  Reviewed home exercise with pt today.  Pt plans to walk and use gym at work for exercise.  Reviewed THR, pulse, RPE, sign and symptoms, NTG use, and when to call 911 or MD.  Also discussed weather considerations and indoor options.  Pt voiced understanding.  [name] is doing well in rehab.  He has recently switched class times and having to work around his work schedule.  He is getting into to class as much as he able.  We will continue to encourage more home exercise and increasing workloads. We will continue to monitor his progress.    Expected Outcomes  Short: Use RPE daily to regulate intensity. Long: Follow program prescription in THR.  Short - continue to attend consistently Long - increase overall MET level  Short - add in home exercise Long - increase stamina  Short: Continue to add in more exercise at home.  Long: Continue to improve stamina.       Discharge Exercise Prescription (Final Exercise Prescription Changes): Exercise Prescription Changes - 07/20/19 1400      Response to Exercise   Blood Pressure (Admit)  108/68    Blood Pressure (Exercise)  170/80    Blood Pressure (Exit)  110/62    Heart Rate (Admit)  103 bpm    Heart Rate (Exercise)  126 bpm    Heart Rate (Exit)  103 bpm    Symptoms  none    Duration  Continue with 30 min of aerobic exercise without signs/symptoms of physical distress.    Intensity  THRR unchanged      Progression   Progression  Continue to progress workloads to maintain intensity without signs/symptoms of physical distress.    Average METs  4.87      Resistance Training    Training Prescription  Yes    Weight  4 lbs    Reps  10-15      Interval Training   Interval Training  No      Treadmill   MPH  3    Grade  1    Minutes  15    METs  3.71      NuStep   Level  5    Minutes  15    METs  6.5      Recumbant Elliptical   Level  3    Minutes  15    METs  4.4      T5 Nustep   Level  4    Minutes  15    METs  3.5      Home Exercise Plan   Plans to continue exercise at  Home (comment)   walk,   gym at work   Frequency  Add 3 additional days to program exercise sessions.    Initial Home Exercises Provided  07/12/19       Nutrition:  Target Goals: Understanding of nutrition guidelines, daily intake of sodium <1571m, cholesterol <2024m calories 30% from fat and 7% or less from saturated fats, daily to have 5 or more servings of fruits and vegetables.  Biometrics: Pre Biometrics - 06/20/19 1036      Pre Biometrics   Height  5' 5.9" (1.674 m)    Weight  218 lb 1.6 oz (98.9 kg)    BMI (Calculated)  35.3    Single Leg Stand  30 seconds        Nutrition Therapy Plan and Nutrition Goals: Nutrition Therapy & Goals - 06/20/19 1042      Nutrition Therapy   Diet  low Na, HH diet    Drug/Food Interactions  Statins/Certain Fruits   lipitor   Protein (specify units)  80g    Fiber  30 grams    Whole Grain Foods  3 servings    Saturated Fats  12 max. grams    Fruits and Vegetables  5 servings/day    Sodium  1.5 grams      Personal Nutrition Goals   Nutrition Goal  ST: mechanical eating/ small frequent meals while appetite low LT: not worry about heart, be able to walk and not worry about heart    Comments  Pt reports that he stopped the keto diet because it was a fad diet and started it due to a recommendation by his cardiologist for weight loss; encouraged pt that overall heart healthy eating would work best for him in terms of health outcomes especially heart health outcomes and overall sustainability. Pt reports having a low  appetitesince heart event and doesn't feel like eating, discussed mechanical eating and small frequent meals. Pt reports he drinks too much sweet tea and soda - discussed how to incorporate more water (pt gets bored with water). Pt reports not eating fried food and has limited salt intake at home. B (if he has it) is cornflakes with whole milk, L: whole wheat sandwich with tu[place]D: lean protein and vegetables (peas and corn or a salad). Pt reports eating cheeseat least 5x/week, uses mrs dash instead of salt as well as other spices and herbs, and uses egg beaters instead of whole eggs (wife uses whole eggs in cooking) Discussed General HH eating. 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      Intervention Plan   Intervention  Prescribe, educate and counsel regarding individualized specific dietary modifications aiming towards targeted core components such as weight, hypertension, lipid management, diabetes, heart failure and other comorbidities.    Expected Outcomes  Short Term Goal: Understand basic principles of dietary content, such as calories, fat, sodium, cholesterol and nutrients.;Short Term Goal: A plan has been developed with personal nutrition goals set during dietitian appointment.;Long Term Goal: Adherence to prescribed nutrition plan.       Nutrition Assessments: Nutrition Assessments - 06/20/19 1042      Rate Your Plate Scores   Pre Score  35       Nutrition Goals Re-Evaluation:   Nutrition Goals Discharge (Final Nutrition Goals Re-Evaluation):   Psychosocial: Target Goals: Acknowledge presence or absence of  significant depression and/or stress, maximize coping skills, provide positive support system. Participant is able to verbalize types and ability to use techniques and skills needed for reducing stress and  depression.   Initial Review & Psychosocial Screening: Initial Psych Review & Screening - 06/17/19 1007      Initial Review   Current issues with  Current Stress Concerns    Source of Stress Concerns  Family;Occupation    Comments  Jeral is currently out of work for about a month due to his NSTEMI. 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 He is staying home with his highschool aged and elementary aged daughters, so helping them with school. He is motivated to work hard and feels less stressed witih this heart attack than with his CABG in 2017.      Family Dynamics   Good Support System?  Yes      Barriers   Psychosocial barriers to participate in program  There are no identifiable barriers or psychosocial needs.      Screening Interventions   Interventions  Encouraged to exercise;To provide support and resources with identified psychosocial needs;Provide feedback about the scores to participant    Expected Outcomes  Short Term goal: Utilizing psychosocial counselor, staff and physician to assist with identification of specific Stressors or current issues interfering with healing process. Setting desired goal for each stressor or current issue identified.;Long Term Goal: Stressors or current issues are controlled or eliminated.;Short Term goal: Identification and review with participant of any Quality of Life or Depression concerns found by scoring the questionnaire.;Long Term goal: The participant improves quality of Life and PHQ9 Scores as seen by post scores and/or verbalization of changes       Quality of Life Scores:   Scores of 19 and below usually indicate a poorer quality of life in these areas.  A difference of  2-3 points is a clinically meaningful difference.  A difference of 2-3 points in the total score of the Quality of Life Index has been associated with significant improvement in overall quality of life, self-image, physical symptoms,  and general health in studies assessing change in quality of life.  PHQ-9: Recent Review Flowsheet Data    Depression screen PHQ 2/9 06/20/2019 12/20/2015   Decreased Interest 0 0   Down, Depressed, Hopeless 0 0   PHQ - 2 Score 0 0   Altered sleeping 0 0   Tired, decreased energy 0 1   Change in appetite 3 2   Feeling bad or failure about yourself  0 0   Trouble concentrating 0 0   Moving slowly or fidgety/restless 0 0   Suicidal thoughts 0 0   PHQ-9 Score 3 3   Difficult doing work/chores Not difficult at all Not difficult at all     Interpretation of Total Score  Total Score Depression Severity:  1-4 = Minimal depression, 5-9 = Mild depression, 10-14 = Moderate depression, 15-19 = Moderately severe depression, 20-27 = Severe depression   Psychosocial Evaluation and Intervention:   Psychosocial Re-Evaluation:   Psychosocial Discharge (Final Psychosocial Re-Evaluation):   Vocational Rehabilitation: Provide vocational rehab assistance to qualifying candidates.   Vocational Rehab Evaluation & Intervention: Vocational Rehab - 06/17/19 1006      Initial Vocational Rehab Evaluation & Intervention   Assessment shows need for Vocational Rehabilitation  No       Education: Education Goals: Education classes will be provided on a variety of topics geared   toward better understanding of heart health and risk factor modification. Participant will state understanding/return demonstration of topics presented as noted by education test scores.  Learning Barriers/Preferences: Learning Barriers/Preferences - 06/17/19 1006      Learning Barriers/Preferences   Learning Barriers  None    Learning Preferences  None       Education Topics:  AED/CPR: - Group verbal and written instruction with the use of models to demonstrate the basic use of the AED with the basic ABC's of resuscitation.   General Nutrition Guidelines/Fats and Fiber: -Group instruction provided by verbal, written  material, models and posters to present the general guidelines for heart healthy nutrition. Gives an explanation and review of dietary fats and fiber.   Controlling Sodium/Reading Food Labels: -Group verbal and written material supporting the discussion of sodium use in heart healthy nutrition. Review and explanation with models, verbal and written materials for utilization of the food label.   Exercise Physiology & General Exercise Guidelines: - Group verbal and written instruction with models to review the exercise physiology of the cardiovascular system and associated critical values. Provides general exercise guidelines with specific guidelines to those with heart or lung disease.    Aerobic Exercise & Resistance Training: - Gives group verbal and written instruction on the various components of exercise. Focuses on aerobic and resistive training programs and the benefits of this training and how to safely progress through these programs..   Flexibility, Balance, Mind/Body Relaxation: Provides group verbal/written instruction on the benefits of flexibility and balance training, including mind/body exercise modes such as yoga, pilates and tai chi.  Demonstration and skill practice provided.   Stress and Anxiety: - Provides group verbal and written instruction about the health risks of elevated stress and causes of high stress.  Discuss the correlation between heart/lung disease and anxiety and treatment options. Review healthy ways to manage with stress and anxiety.   Depression: - Provides group verbal and written instruction on the correlation between heart/lung disease and depressed mood, treatment options, and the stigmas associated with seeking treatment.   Anatomy & Physiology of the Heart: - Group verbal and written instruction and models provide basic cardiac anatomy and physiology, with the coronary electrical and arterial systems. Review of Valvular disease and Heart  Failure   Cardiac Procedures: - Group verbal and written instruction to review commonly prescribed medications for heart disease. Reviews the medication, class of the drug, and side effects. Includes the steps to properly store meds and maintain the prescription regimen. (beta blockers and nitrates)   Cardiac Medications I: - Group verbal and written instruction to review commonly prescribed medications for heart disease. Reviews the medication, class of the drug, and side effects. Includes the steps to properly store meds and maintain the prescription regimen.   Cardiac Medications II: -Group verbal and written instruction to review commonly prescribed medications for heart disease. Reviews the medication, class of the drug, and side effects. (all other drug classes)    Go Sex-Intimacy & Heart Disease, Get SMART - Goal Setting: - Group verbal and written instruction through game format to discuss heart disease and the return to sexual intimacy. Provides group verbal and written material to discuss and apply goal setting through the application of the S.M.A.R.T. Method.   Other Matters of the Heart: - Provides group verbal, written materials and models to describe Stable Angina and Peripheral Artery. Includes description of the disease process and treatment options available to the cardiac patient.   Exercise & Equipment Safety: -   Individual verbal instruction and demonstration of equipment use and safety with use of the equipment.   Cardiac Rehab from 06/20/2019 in [facility]  Date  06/20/19  Educator  JH   Instruction Review Code  1- Verbalizes Understanding      Infection Prevention: - Provides verbal and written material to individual with discussion of infection control including proper hand washing and proper equipment cleaning during exercise session.   Cardiac Rehab from 06/20/2019 in [facility]  Date  06/20/19  Educator  JH   Instruction Review Code  1- Verbalizes Understanding      Falls Prevention: - Provides verbal and written material to individual with discussion of falls prevention and safety.   Cardiac Rehab from 06/20/2019 in [facility]  Date  06/20/19  Educator  JH  Instruction Review Code  1- Verbalizes Understanding      Diabetes: - Individual verbal and written instruction to review signs/symptoms of diabetes, desired ranges of glucose level fasting, after meals and with exercise. Acknowledge that pre and post exercise glucose checks will be done for 3 sessions at entry of program.   Know Your Numbers and Risk Factors: -Group verbal and written instruction about important numbers in your health.  Discussion of what are risk factors and how they play a role in the disease process.  Review of Cholesterol, Blood Pressure, Diabetes, and BMI and the role they play in your overall health.   Sleep Hygiene: -Provides group verbal and written instruction about how sleep can affect your health.  Define sleep hygiene, discuss sleep cycles and impact of sleep habits. Review good sleep hygiene tips.    Other: -Provides group and verbal instruction on various topics (see comments)   Knowledge Questionnaire Score: Knowledge Questionnaire Score - 06/20/19 1048      Knowledge Questionnaire Score   Pre Score  21/26 Education Focus: MI, Heart Failure, PAD, Nutrition, Exercise       Core Components/Risk Factors/Patient Goals at Admission: Personal Goals and Risk Factors at Admission - 06/20/19 1039      Core Components/Risk Factors/Patient Goals on Admission    Weight Management  Yes;Obesity;Weight Loss    Intervention  Weight Management: Develop a combined nutrition and exercise program designed to reach desired caloric intake, while maintaining appropriate intake of nutrient and fiber, sodium and fats, and appropriate energy expenditure required for the weight goal.;Weight  Management: Provide education and appropriate resources to help participant work on and attain dietary goals.;Weight Management/Obesity: Establish reasonable short term and long term weight goals.;Obesity: Provide education and appropriate resources to help participant work on and attain dietary goals.    Admit Weight  218 lb 1.6 oz (98.9 kg)    Goal Weight: Short Term  210 lb (95.3 kg)    Goal Weight: Long Term  200 lb (90.7 kg)    Expected Outcomes  Short Term: Continue to assess and modify interventions until short term weight is achieved;Long Term: Adherence to nutrition and physical activity/exercise program aimed toward attainment of established weight goal;Weight Loss: Understanding of general recommendations for a balanced deficit meal plan, which promotes 1-2 lb weight loss per week and includes a negative energy balance of 500-1000 kcal/d;Understanding recommendations for meals to include 15-35% energy as protein, 25-35% energy from fat, 35-60% energy from carbohydrates, less than 200mg of dietary cholesterol, 20-35 gm of total fiber daily;Understanding of distribution of calorie intake throughout the day with the consumption of 4-5 meals/snacks      Heart Failure  Yes    Intervention  Provide a combined exercise and nutrition program that is supplemented with education, support and counseling about heart failure. Directed toward relieving symptoms such as shortness of breath, decreased exercise tolerance, and extremity edema.    Expected Outcomes  Improve functional capacity of life;Short term: Attendance in program 2-3 days a week with increased exercise capacity. Reported lower sodium intake. Reported increased fruit and vegetable intake. Reports medication compliance.;Short term: Daily weights obtained and reported for increase. Utilizing diuretic protocols set by physician.;Long term: Adoption of self-care skills and reduction of barriers for early signs and symptoms recognition and intervention  leading to self-care maintenance.    Hypertension  Yes    Intervention  Provide education on lifestyle modifcations including regular physical activity/exercise, weight management, moderate sodium restriction and increased consumption of fresh fruit, vegetables, and low fat dairy, alcohol moderation, and smoking cessation.;Monitor prescription use compliance.    Expected Outcomes  Short Term: Continued assessment and intervention until BP is < 140/18m HG in hypertensive participants. < 130/845mHG in hypertensive participants with diabetes, heart failure or chronic kidney disease.;Long Term: Maintenance of blood pressure at goal levels.    Lipids  Yes    Intervention  Provide education and support for participant on nutrition & aerobic/resistive exercise along with prescribed medications to achieve LDL <7029mHDL >75m19m  Expected Outcomes  Short Term: Participant states understanding of desired cholesterol values and is compliant with medications prescribed. Participant is following exercise prescription and nutrition guidelines.;Long Term: Cholesterol controlled with medications as prescribed, with individualized exercise RX and with personalized nutrition plan. Value goals: LDL < 70mg71mL > 40 mg.       Core Components/Risk Factors/Patient Goals Review:  Goals and Risk Factor Review    Row Name 07/14/19 1431 07/14/19 1434           Core Components/Risk Factors/Patient Goals Review   Personal Goals Review  Weight Management/Obesity;Hypertension;Lipids  -      Review  [name] iOctavia Bruckneraking meds as directed.  He does check BP at home a couple times a week.  Overall he feels "great" - feels lighter - has lost around 4 lb.  The main change he has made is exercise.  -      Expected Outcomes  -  Short - add in home exercise Long - continue heart healthy habits         Core Components/Risk Factors/Patient Goals at Discharge (Final Review):  Goals and Risk Factor Review - 07/14/19 1434      Core  Components/Risk Factors/Patient Goals Review   Expected Outcomes  Short - add in home exercise Long - continue heart healthy habits       ITP Comments: ITP Comments    Row Name 06/17/19 1018 06/20/19 1029 06/29/19 0618 07/27/19 1230     ITP Comments  Virtual Initial Orientation completed. Diagnosis can be found in CE 8/23. EP/RD Orienation scheduled for 8/31 at 9:30  Completed 6MWT, gym orientation, and RD evaluation. Initial ITP created and sent for review to Dr. Mark Emily Filbertical Director.  30 Day review. Continue with ITP unless directed changes per Medical Director review.  New to program  30 day review completed. ITP sent to Dr. Mark Emily Filbertical Director of Cardiac and Pulmonary Rehab. Continue with ITP unless changes are made by physician.  Department closed starting 10/2 until further notice by infection prevention and Health at Work teams for [place]  Comments: 30 day review

## 2019-08-02 ENCOUNTER — Telehealth: Payer: Self-pay | Admitting: *Deleted

## 2019-08-02 NOTE — Telephone Encounter (Signed)
Called to check on pt.  Out since 9/28.  Left message.

## 2019-08-11 ENCOUNTER — Telehealth: Payer: Self-pay | Admitting: *Deleted

## 2019-08-11 NOTE — Telephone Encounter (Signed)
Left Message. Patient has not been to Cardiac Rehab since 9/28.

## 2019-08-18 ENCOUNTER — Telehealth: Payer: Self-pay

## 2019-08-18 NOTE — Telephone Encounter (Signed)
LMOM

## 2019-08-22 ENCOUNTER — Encounter: Payer: BC Managed Care – PPO | Attending: Cardiovascular Disease

## 2019-08-22 DIAGNOSIS — E7800 Pure hypercholesterolemia, unspecified: Secondary | ICD-10-CM | POA: Insufficient documentation

## 2019-08-22 DIAGNOSIS — Z79899 Other long term (current) drug therapy: Secondary | ICD-10-CM | POA: Insufficient documentation

## 2019-08-22 DIAGNOSIS — I214 Non-ST elevation (NSTEMI) myocardial infarction: Secondary | ICD-10-CM | POA: Insufficient documentation

## 2019-08-22 DIAGNOSIS — I252 Old myocardial infarction: Secondary | ICD-10-CM | POA: Insufficient documentation

## 2019-08-22 DIAGNOSIS — Z7982 Long term (current) use of aspirin: Secondary | ICD-10-CM | POA: Insufficient documentation

## 2019-08-22 DIAGNOSIS — Z87891 Personal history of nicotine dependence: Secondary | ICD-10-CM | POA: Insufficient documentation

## 2019-08-22 DIAGNOSIS — I509 Heart failure, unspecified: Secondary | ICD-10-CM | POA: Insufficient documentation

## 2019-08-22 DIAGNOSIS — I2510 Atherosclerotic heart disease of native coronary artery without angina pectoris: Secondary | ICD-10-CM | POA: Insufficient documentation

## 2019-08-22 DIAGNOSIS — I110 Hypertensive heart disease with heart failure: Secondary | ICD-10-CM | POA: Insufficient documentation

## 2019-08-22 DIAGNOSIS — Z955 Presence of coronary angioplasty implant and graft: Secondary | ICD-10-CM | POA: Insufficient documentation

## 2019-08-24 ENCOUNTER — Encounter: Payer: Self-pay | Admitting: *Deleted

## 2019-08-24 DIAGNOSIS — Z955 Presence of coronary angioplasty implant and graft: Secondary | ICD-10-CM

## 2019-08-24 DIAGNOSIS — I214 Non-ST elevation (NSTEMI) myocardial infarction: Secondary | ICD-10-CM

## 2019-08-24 NOTE — Progress Notes (Signed)
Cardiac Individual Treatment Plan  Patient Details  Name: Gregg [name] MRN: [id number] Date of Birth: 01/18/1976 Referring Provider:     Cardiac Rehab from 06/20/2019 in [facility] Cardiac and Pulmonary Rehab  Referring Provider  Leodis Sias MD      Initial Encounter Date:    Cardiac Rehab from 06/20/2019 in [facility] Cardiac and Pulmonary Rehab  Date  06/20/19      Visit Diagnosis: NSTEMI (non-ST elevated myocardial infarction) [facility])  Status post coronary artery stent placement  Patient's Home Medications on Admission:  Current Outpatient Medications:  .  amiodarone (PACERONE) 200 MG tablet, Take 1 tablet by mouth daily., Disp: , Rfl:  .  aspirin EC 81 MG tablet, Take 81 mg by mouth daily., Disp: , Rfl:  .  atorvastatin (LIPITOR) 80 MG tablet, Take 1 tablet by mouth daily., Disp: , Rfl:  .  Evolocumab (REPATHA) 140 MG/ML SOSY, Inject into the skin., Disp: , Rfl:  .  furosemide (LASIX) 20 MG tablet, Take 1 tablet by mouth daily., Disp: , Rfl:  .  gabapentin (NEURONTIN) 100 MG capsule, Take 1 capsule by mouth 3 (three) times daily., Disp: , Rfl:  .  lisinopril (ZESTRIL) 20 MG tablet, Take by mouth. , Disp: , Rfl:  .  metoprolol succinate (TOPROL-XL) 25 MG 24 hr tablet, Take 1 tablet by mouth daily. , Disp: , Rfl:  .  nitroGLYCERIN (NITROSTAT) 0.4 MG SL tablet, Place under the tongue., Disp: , Rfl:  .  oxyCODONE (OXY IR/ROXICODONE) 5 MG immediate release tablet, Take 1 tablet by mouth every 4 (four) hours as needed. , Disp: , Rfl:  .  prasugrel (EFFIENT) 10 MG TABS tablet, Take by mouth., Disp: , Rfl:   Past Medical History: Past Medical History:  Diagnosis Date  . CHF (congestive heart failure) ([place])   . Coronary artery disease   . High cholesterol   . Hypertension   . ST elevation (STEMI) myocardial infarction involving left anterior descending coronary artery [facility]) Jan 2016  . Tobacco use     Tobacco Use: Social History   Tobacco Use  Smoking Status Former Smoker  .  Packs/day: 1.50  . Years: 20.00  . Pack years: 30.00  . Types: Cigarettes  . Quit date: 10/05/2015  . Years since quitting: 3.8  Smokeless Tobacco Never Used    Labs: Recent Review Flowsheet Data    There is no flowsheet data to display.       Exercise Target Goals: Exercise Program Goal: Individual exercise prescription set using results from initial 6 min walk test and THRR while considering  patient's activity barriers and safety.   Exercise Prescription Goal: Initial exercise prescription builds to 30-45 minutes a day of aerobic activity, 2-3 days per week.  Home exercise guidelines will be given to patient during program as part of exercise prescription that the participant will acknowledge.  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 HR  117 bpm     Max Ex. BP  152/74     2 Minute Post BP  124/66        Oxygen Initial Assessment:   Oxygen Re-Evaluation:   Oxygen Discharge (Final Oxygen Re-Evaluation):   Initial Exercise Prescription: Initial Exercise Prescription - 06/20/19 1000      Date of Initial Exercise RX and Referring Provider   Date  06/20/19    Referring  Provider  Leodis Sias MD      Treadmill   MPH  3    Grade  1    Minutes  15    METs  3.71      NuStep   Level  4    SPM  80    Minutes  15    METs  3      Recumbant Elliptical   Level  3    RPM  50    Minutes  15    METs  3      T5 Nustep   Level  4    SPM  80    Minutes  15    METs  3      Prescription Details   Frequency (times per week)  3    Duration  Progress to 30 minutes of continuous aerobic without signs/symptoms of physical distress      Intensity   THRR 40-80% of Max Heartrate  117-156    Ratings of Perceived Exertion  11-13    Perceived Dyspnea  0-4      Progression   Progression  Continue to progress workloads to maintain intensity without signs/symptoms of physical distress.      Resistance Training   Training Prescription  Yes    Weight  4 lbs    Reps  10-15       Perform Capillary Blood Glucose checks as needed.  Exercise Prescription Changes: Exercise Prescription Changes    Row Name 06/20/19 1000 07/08/19 1200 07/20/19 1400         Response to Exercise   Blood Pressure (Admit)  122/64  138/70  108/68     Blood Pressure (Exercise)  152/74  160/80  170/80     Blood Pressure (Exit)  124/66  122/80  110/62     Heart Rate (Admit)  77 bpm  94 bpm  103 bpm     Heart Rate (Exercise)  117 bpm  120 bpm  126 bpm     Heart Rate (Exit)  84 bpm  95 bpm  103 bpm     Oxygen Saturation (Admit)  97 %  -  -     Oxygen Saturation (Exercise)  97 %  -  -     Rating of Perceived Exertion (Exercise)  11  -  -  Perceived Dyspnea (Exercise)  11  -  -     Symptoms  none  none  none     Comments  walk test results  -  -     Duration  -  Continue with 30 min of aerobic exercise without signs/symptoms of physical distress.  Continue with 30 min of aerobic exercise without signs/symptoms of physical distress.     Intensity  -  THRR unchanged  THRR unchanged       Progression   Progression  -  Continue to progress workloads to maintain intensity without  signs/symptoms of physical distress.  Continue to progress workloads to maintain intensity without signs/symptoms of physical distress.     Average METs  -  4.75  4.87       Resistance Training   Training Prescription  -  Yes  Yes     Weight  -  4 lb  4 lbs     Reps  -  10-15  10-15       Interval Training   Interval Training  -  -  No       Treadmill   MPH  -  3  3     Grade  -  1  1     Minutes  -  15  15     METs  -  3.71  3.71       NuStep   Level  -  5  5     SPM  -  80  -     Minutes  -  15  15     METs  -  6.5  6.5       Recumbant Elliptical   Level  -  3  3     RPM  -  50  -     Minutes  -  15  15     METs  -  3  4.4       T5 Nustep   Level  -  -  4     Minutes  -  -  15     METs  -  -  3.5       Home Exercise Plan   Plans to continue exercise at  -  -  Home (comment) walk, gym at work     Frequency  -  -  Add 3 additional days to program exercise sessions.     Initial Home Exercises Provided  -  -  07/12/19        Exercise Comments: Exercise Comments    Row Name 06/22/19 1704           Exercise Comments  First full day of exercise!  Patient was oriented to gym and equipment including functions, settings, policies, and procedures.  Patient's individual exercise prescription and treatment plan were reviewed.  All starting workloads were established based on the results of the 6 minute walk test done at initial orientation visit.  The plan for exercise progression was also introduced and progression will be customized based on patient's performance and goals.          Exercise Goals and Review: Exercise Goals    Row Name 06/20/19 1036             Exercise Goals   Increase Physical Activity  Yes       Intervention  Provide advice, education, support and counseling about physical activity/exercise needs.;Develop an individualized  exercise prescription for aerobic and resistive training based on initial evaluation findings, risk stratification,  comorbidities and participant's personal goals.       Expected Outcomes  Short Term: Attend rehab on a regular basis to increase amount of physical activity.;Long Term: Add in home exercise to make exercise part of routine and to increase amount of physical activity.;Long Term: Exercising regularly at least 3-5 days a week.       Increase Strength and Stamina  Yes       Intervention  Provide advice, education, support and counseling about physical activity/exercise needs.;Develop an individualized exercise prescription for aerobic and resistive training based on initial evaluation findings, risk stratification, comorbidities and participant's personal goals.       Expected Outcomes  Short Term: Increase workloads from initial exercise prescription for resistance, speed, and METs.;Short Term: Perform resistance training exercises routinely during rehab and add in resistance training at home;Long Term: Improve cardiorespiratory fitness, muscular endurance and strength as measured by increased METs and functional capacity (6MWT)       Able to understand and use rate of perceived exertion (RPE) scale  Yes       Intervention  Provide education and explanation on how to use RPE scale       Expected Outcomes  Short Term: Able to use RPE daily in rehab to express subjective intensity level;Long Term:  Able to use RPE to guide intensity level when exercising independently       Able to understand and use Dyspnea scale  Yes       Intervention  Provide education and explanation on how to use Dyspnea scale       Expected Outcomes  Short Term: Able to use Dyspnea scale daily in rehab to express subjective sense of shortness of breath during exertion;Long Term: Able to use Dyspnea scale to guide intensity level when exercising independently       Knowledge and understanding of Target Heart Rate Range (THRR)  Yes       Intervention  Provide education and explanation of THRR including how the numbers were predicted and  where they are located for reference       Expected Outcomes  Short Term: Able to state/look up THRR;Short Term: Able to use daily as guideline for intensity in rehab;Long Term: Able to use THRR to govern intensity when exercising independently       Able to check pulse independently  Yes       Intervention  Provide education and demonstration on how to check pulse in carotid and radial arteries.;Review the importance of being able to check your own pulse for safety during independent exercise       Expected Outcomes  Short Term: Able to explain why pulse checking is important during independent exercise;Long Term: Able to check pulse independently and accurately       Understanding of Exercise Prescription  Yes       Intervention  Provide education, explanation, and written materials on patient's individual exercise prescription       Expected Outcomes  Long Term: Able to explain home exercise prescription to exercise independently;Short Term: Able to explain program exercise prescription          Exercise Goals Re-Evaluation : Exercise Goals Re-Evaluation    Row Name 06/22/19 1707 07/08/19 1255 07/14/19 1436 07/19/19 1450 08/02/19 1158     Exercise Goal Re-Evaluation   Exercise Goals Review  Increase Physical Activity;Able to understand and use rate of perceived exertion (RPE)  scale;Knowledge and understanding of Target Heart Rate Range (THRR);Understanding of Exercise Prescription;Increase Strength and Stamina;Able to check pulse independently  Increase Physical Activity;Increase Strength and Stamina;Able to understand and use rate of perceived exertion (RPE) scale;Knowledge and understanding of Target Heart Rate Range (THRR);Able to check pulse independently;Understanding of Exercise Prescription  Increase Physical Activity;Increase Strength and Stamina;Able to understand and use rate of perceived exertion (RPE) scale;Knowledge and understanding of Target Heart Rate Range (THRR);Able to check  pulse independently;Understanding of Exercise Prescription  Increase Physical Activity;Increase Strength and Stamina;Understanding of Exercise Prescription  -   Comments  Reviewed RPE scale, THR and program prescription with pt today.  Pt voiced understanding and was given a copy of goals to take home.  Gregg [name] is doing well in rehab.  He should be able to progress quickly.  Staff will monitor progress  Reviewed home exercise with pt today.  Pt plans to walk and use gym at work for exercise.  Reviewed THR, pulse, RPE, sign and symptoms, NTG use, and when to call 911 or MD.  Also discussed weather considerations and indoor options.  Pt voiced understanding.  Gregg [name] is doing well in rehab.  He has recently switched class times and having to work around his work schedule.  He is getting into to class as much as he able.  We will continue to encourage more home exercise and increasing workloads. We will continue to monitor his progress.  Out since last review   Expected Outcomes  Short: Use RPE daily to regulate intensity. Long: Follow program prescription in THR.  Short - continue to attend consistently Long - increase overall MET level  Short - add in home exercise Long - increase stamina  Short: Continue to add in more exercise at home.  Long: Continue to improve stamina.  -      Discharge Exercise Prescription (Final Exercise Prescription Changes): Exercise Prescription Changes - 07/20/19 1400      Response to Exercise   Blood Pressure (Admit)  108/68    Blood Pressure (Exercise)  170/80    Blood Pressure (Exit)  110/62    Heart Rate (Admit)  103 bpm    Heart Rate (Exercise)  126 bpm    Heart Rate (Exit)  103 bpm    Symptoms  none    Duration  Continue with 30 min of aerobic exercise without signs/symptoms of physical distress.    Intensity  THRR unchanged      Progression   Progression  Continue to progress workloads to maintain intensity without signs/symptoms of physical distress.    Average METs   4.87      Resistance Training   Training Prescription  Yes    Weight  4 lbs    Reps  10-15      Interval Training   Interval Training  No      Treadmill   MPH  3    Grade  1    Minutes  15    METs  3.71      NuStep   Level  5    Minutes  15    METs  6.5      Recumbant Elliptical   Level  3    Minutes  15    METs  4.4      T5 Nustep   Level  4    Minutes  15    METs  3.5      Home Exercise Plan   Plans to continue exercise at  Home (comment)   walk, gym at work   Frequency  Add 3 additional days to program exercise sessions.    Initial Home Exercises Provided  07/12/19       Nutrition:  Target Goals: Understanding of nutrition guidelines, daily intake of sodium <157m, cholesterol <2015m calories 30% from fat and 7% or less from saturated fats, daily to have 5 or more servings of fruits and vegetables.  Biometrics: Pre Biometrics - 06/20/19 1036      Pre Biometrics   Height  5' 5.9" (1.674 m)    Weight  218 lb 1.6 oz (98.9 kg)    BMI (Calculated)  35.3    Single Leg Stand  30 seconds        Nutrition Therapy Plan and Nutrition Goals: Nutrition Therapy & Goals - 06/20/19 1042      Nutrition Therapy   Diet  low Na, HH diet    Drug/Food Interactions  Statins/Certain Fruits   lipitor   Protein (specify units)  80g    Fiber  30 grams    Whole Grain Foods  3 servings    Saturated Fats  12 max. grams    Fruits and Vegetables  5 servings/day    Sodium  1.5 grams      Personal Nutrition Goals   Nutrition Goal  ST: mechanical eating/ small frequent meals while appetite low LT: not worry about heart, be able to walk and not worry about heart    Comments  Pt reports that he stopped the keto diet because it was a fad diet and started it due to a recommendation by his cardiologist for weight loss; encouraged pt that overall heart healthy eating would work best for him in terms of health outcomes especially heart health outcomes and overall sustainability. Pt  reports having a low appetitesince heart event and doesn't feel like eating, discussed mechanical eating and small frequent meals. Pt reports he drinks too much sweet tea and soda - discussed how to incorporate more water (pt gets bored with water). Pt reports not eating fried food and has limited salt intake at home. B (if he has it) is cornflakes with whole milk, L: whole wheat sandwich with tu[place]D: lean protein and vegetables (peas and corn or a salad). Pt reports eating cheeseat least 5x/week, uses mrs dash instead of salt as well as other spices and herbs, and uses egg beaters instead of whole eggs (wife uses whole eggs in cooking) Discussed General HH eating. 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      Intervention Plan   Intervention  Prescribe, educate and counsel regarding individualized specific dietary modifications aiming towards targeted core components such as weight, hypertension, lipid management, diabetes, heart failure and other comorbidities.    Expected Outcomes  Short Term Goal: Understand basic principles of dietary content, such as calories, fat, sodium, cholesterol and nutrients.;Short Term Goal: A plan has been developed with personal nutrition goals set during dietitian appointment.;Long Term Goal: Adherence to prescribed nutrition plan.       Nutrition Assessments: Nutrition Assessments - 06/20/19 1042      Rate Your Plate Scores   Pre Score  35       Nutrition Goals Re-Evaluation:   Nutrition Goals Discharge (Final Nutrition Goals Re-Evaluation):   Psychosocial: Target Goals:  Acknowledge presence or absence of significant depression and/or stress, maximize coping skills, provide positive support system. Participant is able to verbalize types and ability to use techniques and skills needed for  reducing stress and depression.   Initial Review & Psychosocial Screening: Initial Psych Review & Screening - 06/17/19 1007      Initial Review   Current issues with  Current Stress Concerns    Source of Stress Concerns  Family;Occupation    Comments  Shimshon is currently out of work for about a month due to his NSTEMI. 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 He is staying home with his highschool aged and elementary aged daughters, so helping them with school. He is motivated to work hard and feels less stressed witih this heart attack than with his CABG in 2017.      Family Dynamics   Good Support System?  Yes      Barriers   Psychosocial barriers to participate in program  There are no identifiable barriers or psychosocial needs.      Screening Interventions   Interventions  Encouraged to exercise;To provide support and resources with identified psychosocial needs;Provide feedback about the scores to participant    Expected Outcomes  Short Term goal: Utilizing psychosocial counselor, staff and physician to assist with identification of specific Stressors or current issues interfering with healing process. Setting desired goal for each stressor or current issue identified.;Long Term Goal: Stressors or current issues are controlled or eliminated.;Short Term goal: Identification and review with participant of any Quality of Life or Depression concerns found by scoring the questionnaire.;Long Term goal: The participant improves quality of Life and PHQ9 Scores as seen by post scores and/or verbalization of changes       Quality of Life Scores:   Scores of 19 and below usually indicate a poorer quality of life in these areas.  A difference of  2-3 points is a clinically meaningful difference.  A difference of 2-3 points in the total score of the Quality of Life Index has been associated with significant improvement in overall quality of life, self-image,  physical symptoms, and general health in studies assessing change in quality of life.  PHQ-9: Recent Review Flowsheet Data    Depression screen [facility] 2/9 06/20/2019 12/20/2015   Decreased Interest 0 0   Down, Depressed, Hopeless 0 0   PHQ - 2 Score 0 0   Altered sleeping 0 0   Tired, decreased energy 0 1   Change in appetite 3 2   Feeling bad or failure about yourself  0 0   Trouble concentrating 0 0   Moving slowly or fidgety/restless 0 0   Suicidal thoughts 0 0   PHQ-9 Score 3 3   Difficult doing work/chores Not difficult at all Not difficult at all     Interpretation of Total Score  Total Score Depression Severity:  1-4 = Minimal depression, 5-9 = Mild depression, 10-14 = Moderate depression, 15-19 = Moderately severe depression, 20-27 = Severe depression   Psychosocial Evaluation and Intervention:   Psychosocial Re-Evaluation:   Psychosocial Discharge (Final Psychosocial Re-Evaluation):   Vocational Rehabilitation: Provide vocational rehab assistance to qualifying candidates.   Vocational Rehab Evaluation & Intervention: Vocational Rehab - 06/17/19 1006      Initial Vocational Rehab Evaluation & Intervention   Assessment shows need for Vocational Rehabilitation  No       Education: Education Goals: Education classes will be provided on  a variety of topics geared toward better understanding of heart health and risk factor modification. Participant will state understanding/return demonstration of topics presented as noted by education test scores.  Learning Barriers/Preferences: Learning Barriers/Preferences - 06/17/19 1006      Learning Barriers/Preferences   Learning Barriers  None    Learning Preferences  None       Education Topics:  AED/CPR: - Group verbal and written instruction with the use of models to demonstrate the basic use of the AED with the basic ABC's of resuscitation.   General Nutrition Guidelines/Fats and Fiber: -Group instruction provided  by verbal, written material, models and posters to present the general guidelines for heart healthy nutrition. Gives an explanation and review of dietary fats and fiber.   Controlling Sodium/Reading Food Labels: -Group verbal and written material supporting the discussion of sodium use in heart healthy nutrition. Review and explanation with models, verbal and written materials for utilization of the food label.   Exercise Physiology & General Exercise Guidelines: - Group verbal and written instruction with models to review the exercise physiology of the cardiovascular system and associated critical values. Provides general exercise guidelines with specific guidelines to those with heart or lung disease.    Aerobic Exercise & Resistance Training: - Gives group verbal and written instruction on the various components of exercise. Focuses on aerobic and resistive training programs and the benefits of this training and how to safely progress through these programs..   Flexibility, Balance, Mind/Body Relaxation: Provides group verbal/written instruction on the benefits of flexibility and balance training, including mind/body exercise modes such as yoga, pilates and tai chi.  Demonstration and skill practice provided.   Stress and Anxiety: - Provides group verbal and written instruction about the health risks of elevated stress and causes of high stress.  Discuss the correlation between heart/lung disease and anxiety and treatment options. Review healthy ways to manage with stress and anxiety.   Depression: - Provides group verbal and written instruction on the correlation between heart/lung disease and depressed mood, treatment options, and the stigmas associated with seeking treatment.   Anatomy & Physiology of the Heart: - Group verbal and written instruction and models provide basic cardiac anatomy and physiology, with the coronary electrical and arterial systems. Review of Valvular disease  and Heart Failure   Cardiac Procedures: - Group verbal and written instruction to review commonly prescribed medications for heart disease. Reviews the medication, class of the drug, and side effects. Includes the steps to properly store meds and maintain the prescription regimen. (beta blockers and nitrates)   Cardiac Medications I: - Group verbal and written instruction to review commonly prescribed medications for heart disease. Reviews the medication, class of the drug, and side effects. Includes the steps to properly store meds and maintain the prescription regimen.   Cardiac Medications II: -Group verbal and written instruction to review commonly prescribed medications for heart disease. Reviews the medication, class of the drug, and side effects. (all other drug classes)    Go Sex-Intimacy & Heart Disease, Get SMART - Goal Setting: - Group verbal and written instruction through game format to discuss heart disease and the return to sexual intimacy. Provides group verbal and written material to discuss and apply goal setting through the application of the S.M.A.R.T. Method.   Other Matters of the Heart: - Provides group verbal, written materials and models to describe Stable Angina and Peripheral Artery. Includes description of the disease process and treatment options available to the cardiac patient.  Exercise & Equipment Safety: - Individual verbal instruction and demonstration of equipment use and safety with use of the equipment.   Cardiac Rehab from 06/20/2019 in [facility] Cardiac and Pulmonary Rehab  Date  06/20/19  Educator  [facility]   Instruction Review Code  1- Verbalizes Understanding      Infection Prevention: - Provides verbal and written material to individual with discussion of infection control including proper hand washing and proper equipment cleaning during exercise session.   Cardiac Rehab from 06/20/2019 in [facility] Cardiac and Pulmonary Rehab  Date  06/20/19  Educator   [facility]  Instruction Review Code  1- Verbalizes Understanding      Falls Prevention: - Provides verbal and written material to individual with discussion of falls prevention and safety.   Cardiac Rehab from 06/20/2019 in [facility] Cardiac and Pulmonary Rehab  Date  06/20/19  Educator  [facility]  Instruction Review Code  1- Verbalizes Understanding      Diabetes: - Individual verbal and written instruction to review signs/symptoms of diabetes, desired ranges of glucose level fasting, after meals and with exercise. Acknowledge that pre and post exercise glucose checks will be done for 3 sessions at entry of program.   Know Your Numbers and Risk Factors: -Group verbal and written instruction about important numbers in your health.  Discussion of what are risk factors and how they play a role in the disease process.  Review of Cholesterol, Blood Pressure, Diabetes, and BMI and the role they play in your overall health.   Sleep Hygiene: -Provides group verbal and written instruction about how sleep can affect your health.  Define sleep hygiene, discuss sleep cycles and impact of sleep habits. Review good sleep hygiene tips.    Other: -Provides group and verbal instruction on various topics (see comments)   Knowledge Questionnaire Score: Knowledge Questionnaire Score - 06/20/19 1048      Knowledge Questionnaire Score   Pre Score  21/26 Education Focus: MI, Heart Failure, PAD, Nutrition, Exercise       Core Components/Risk Factors/Patient Goals at Admission: Personal Goals and Risk Factors at Admission - 06/20/19 1039      Core Components/Risk Factors/Patient Goals on Admission    Weight Management  Yes;Obesity;Weight Loss    Intervention  Weight Management: Develop a combined nutrition and exercise program designed to reach desired caloric intake, while maintaining appropriate intake of nutrient and fiber, sodium and fats, and appropriate energy expenditure required for the weight goal.;Weight  Management: Provide education and appropriate resources to help participant work on and attain dietary goals.;Weight Management/Obesity: Establish reasonable short term and long term weight goals.;Obesity: Provide education and appropriate resources to help participant work on and attain dietary goals.    Admit Weight  218 lb 1.6 oz (98.9 kg)    Goal Weight: Short Term  210 lb (95.3 kg)    Goal Weight: Long Term  200 lb (90.7 kg)    Expected Outcomes  Short Term: Continue to assess and modify interventions until short term weight is achieved;Long Term: Adherence to nutrition and physical activity/exercise program aimed toward attainment of established weight goal;Weight Loss: Understanding of general recommendations for a balanced deficit meal plan, which promotes 1-2 lb weight loss per week and includes a negative energy balance of [phone] kcal/d;Understanding recommendations for meals to include 15-35% energy as protein, 25-35% energy from fat, 35-60% energy from carbohydrates, less than 270m of dietary cholesterol, 20-35 gm of total fiber daily;Understanding of distribution of calorie intake throughout the day with the consumption  of 4-5 meals/snacks    Heart Failure  Yes    Intervention  Provide a combined exercise and nutrition program that is supplemented with education, support and counseling about heart failure. Directed toward relieving symptoms such as shortness of breath, decreased exercise tolerance, and extremity edema.    Expected Outcomes  Improve functional capacity of life;Short term: Attendance in program 2-3 days a week with increased exercise capacity. Reported lower sodium intake. Reported increased fruit and vegetable intake. Reports medication compliance.;Short term: Daily weights obtained and reported for increase. Utilizing diuretic protocols set by physician.;Long term: Adoption of self-care skills and reduction of barriers for early signs and symptoms recognition and intervention  leading to self-care maintenance.    Hypertension  Yes    Intervention  Provide education on lifestyle modifcations including regular physical activity/exercise, weight management, moderate sodium restriction and increased consumption of fresh fruit, vegetables, and low fat dairy, alcohol moderation, and smoking cessation.;Monitor prescription use compliance.    Expected Outcomes  Short Term: Continued assessment and intervention until BP is < 140/80m HG in hypertensive participants. < 130/820mHG in hypertensive participants with diabetes, heart failure or chronic kidney disease.;Long Term: Maintenance of blood pressure at goal levels.    Lipids  Yes    Intervention  Provide education and support for participant on nutrition & aerobic/resistive exercise along with prescribed medications to achieve LDL <7030mHDL >63m50m  Expected Outcomes  Short Term: Participant states understanding of desired cholesterol values and is compliant with medications prescribed. Participant is following exercise prescription and nutrition guidelines.;Long Term: Cholesterol controlled with medications as prescribed, with individualized exercise RX and with personalized nutrition plan. Value goals: LDL < 70mg35mL > 40 mg.       Core Components/Risk Factors/Patient Goals Review:  Goals and Risk Factor Review    Row Name 07/14/19 1431 07/14/19 1434           Core Components/Risk Factors/Patient Goals Review   Personal Goals Review  Weight Management/Obesity;Hypertension;Lipids  -      Review  [name] iOctavia Bruckneraking meds as directed.  He does check BP at home a couple times a week.  Overall he feels "great" - feels lighter - has lost around 4 lb.  The main change he has made is exercise.  -      Expected Outcomes  -  Short - add in home exercise Long - continue heart healthy habits         Core Components/Risk Factors/Patient Goals at Discharge (Final Review):  Goals and Risk Factor Review - 07/14/19 1434      Core  Components/Risk Factors/Patient Goals Review   Expected Outcomes  Short - add in home exercise Long - continue heart healthy habits       ITP Comments: ITP Comments    Row Name 06/17/19 1018 06/20/19 1029 06/29/19 0618 07/27/19 1230 08/02/19 1159   ITP Comments  Virtual Initial Orientation completed. Diagnosis can be found in CE 8/23. EP/RD Orienation scheduled for 8/31 at 9:30  Completed 6MWT, gym orientation, and RD evaluation. Initial ITP created and sent for review to Dr. Mark Emily Filbertical Director.  30 Day review. Continue with ITP unless directed changes per Medical Director review.  New to program  30 day review completed. ITP sent to Dr. Mark Emily Filbertical Director of Cardiac and Pulmonary Rehab. Continue with ITP unless changes are made by physician.  Department closed starting 10/2 until further notice by infection prevention and Health at Work teams for  COVID-19.  Called to check on pt.  Out since 9/28.  Left message.   [place] Name 08/18/19 1433 08/24/19 0640         ITP Comments  Pt has not attended since 9/28.  Staff have left messages 3 times  30 day review completed. Continue with ITP sent to Dr. Emily Filbert, Medical Director of Cardiac and Pulmonary Rehab for review , changes as needed and signature.  Last visit 9/28         Comments:

## 2019-09-01 ENCOUNTER — Encounter: Payer: Self-pay | Admitting: *Deleted

## 2019-09-01 NOTE — Progress Notes (Signed)
Cardiac Individual Treatment Plan  Patient Details  Name: Gregg [name] MRN: [id number] Date of Birth: 1976-08-14 Referring Provider:     Cardiac Rehab from 06/20/2019 in [facility] Cardiac and Pulmonary Rehab  Referring Provider  Leodis Sias MD      Initial Encounter Date:    Cardiac Rehab from 06/20/2019 in [facility] Cardiac and Pulmonary Rehab  Date  06/20/19      Visit Diagnosis: No diagnosis found.  Patient's Home Medications on Admission:  Current Outpatient Medications:  .  amiodarone (PACERONE) 200 MG tablet, Take 1 tablet by mouth daily., Disp: , Rfl:  .  aspirin EC 81 MG tablet, Take 81 mg by mouth daily., Disp: , Rfl:  .  atorvastatin (LIPITOR) 80 MG tablet, Take 1 tablet by mouth daily., Disp: , Rfl:  .  Evolocumab (REPATHA) 140 MG/ML SOSY, Inject into the skin., Disp: , Rfl:  .  furosemide (LASIX) 20 MG tablet, Take 1 tablet by mouth daily., Disp: , Rfl:  .  gabapentin (NEURONTIN) 100 MG capsule, Take 1 capsule by mouth 3 (three) times daily., Disp: , Rfl:  .  lisinopril (ZESTRIL) 20 MG tablet, Take by mouth. , Disp: , Rfl:  .  metoprolol succinate (TOPROL-XL) 25 MG 24 hr tablet, Take 1 tablet by mouth daily. , Disp: , Rfl:  .  nitroGLYCERIN (NITROSTAT) 0.4 MG SL tablet, Place under the tongue., Disp: , Rfl:  .  oxyCODONE (OXY IR/ROXICODONE) 5 MG immediate release tablet, Take 1 tablet by mouth every 4 (four) hours as needed. , Disp: , Rfl:  .  prasugrel (EFFIENT) 10 MG TABS tablet, Take by mouth., Disp: , Rfl:   Past Medical History: Past Medical History:  Diagnosis Date  . CHF (congestive heart failure) ([place])   . Coronary artery disease   . High cholesterol   . Hypertension   . ST elevation (STEMI) myocardial infarction involving left anterior descending coronary artery [facility]) Jan 2016  . Tobacco use     Tobacco Use: Social History   Tobacco Use  Smoking Status Former Smoker  . Packs/day: 1.50  . Years: 20.00  . Pack years: 30.00  . Types: Cigarettes  . Quit  date: 10/05/2015  . Years since quitting: 3.9  Smokeless Tobacco Never Used    Labs: Recent Review Flowsheet Data    There is no flowsheet data to display.       Exercise Target Goals: Exercise Program Goal: Individual exercise prescription set using results from initial 6 min walk test and THRR while considering  patient's activity barriers and safety.   Exercise Prescription Goal: Initial exercise prescription builds to 30-45 minutes a day of aerobic activity, 2-3 days per week.  Home exercise guidelines will be given to patient during program as part of exercise prescription that the participant will acknowledge.  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 HR  117 bpm     Max Ex. BP  152/74     2 Minute Post BP  124/66        Oxygen Initial Assessment:   Oxygen Re-Evaluation:   Oxygen Discharge (Final Oxygen Re-Evaluation):   Initial Exercise Prescription: Initial Exercise Prescription - 06/20/19 1000      Date of Initial Exercise RX and Referring Provider   Date  06/20/19    Referring Provider  Leodis Sias MD      Treadmill   MPH  3    Grade  1    Minutes  15     METs  3.71      NuStep   Level  4    SPM  80    Minutes  15    METs  3      Recumbant Elliptical   Level  3    RPM  50    Minutes  15    METs  3      T5 Nustep   Level  4    SPM  80    Minutes  15    METs  3      Prescription Details   Frequency (times per week)  3    Duration  Progress to 30 minutes of continuous aerobic without signs/symptoms of physical distress      Intensity   THRR 40-80% of Max Heartrate  117-156    Ratings of Perceived Exertion  11-13    Perceived Dyspnea  0-4      Progression   Progression  Continue to progress workloads to maintain intensity without signs/symptoms of physical distress.      Resistance Training   Training Prescription  Yes    Weight  4 lbs    Reps  10-15       Perform Capillary Blood Glucose checks as needed.  Exercise Prescription Changes: Exercise Prescription Changes    Row Name 06/20/19 1000 07/08/19 1200 07/20/19 1400         Response to Exercise   Blood Pressure (Admit)  122/64  138/70  108/68     Blood Pressure (Exercise)  152/74  160/80  170/80     Blood Pressure (Exit)  124/66  122/80  110/62     Heart Rate (Admit)  77 bpm  94 bpm  103 bpm     Heart Rate (Exercise)  117 bpm  120 bpm  126 bpm     Heart Rate (Exit)  84 bpm  95 bpm  103 bpm     Oxygen Saturation (Admit)  97 %  -  -     Oxygen Saturation (Exercise)  97 %  -  -     Rating of Perceived Exertion (Exercise)  11  -  -     Perceived Dyspnea (Exercise)  11  -  -  Symptoms  none  none  none     Comments  walk test results  -  -     Duration  -  Continue with 30 min of aerobic exercise without signs/symptoms of physical distress.  Continue with 30 min of aerobic exercise without signs/symptoms of physical distress.     Intensity  -  THRR unchanged  THRR unchanged       Progression   Progression  -  Continue to progress workloads to maintain intensity without signs/symptoms of physical distress.  Continue to progress workloads to maintain  intensity without signs/symptoms of physical distress.     Average METs  -  4.75  4.87       Resistance Training   Training Prescription  -  Yes  Yes     Weight  -  4 lb  4 lbs     Reps  -  10-15  10-15       Interval Training   Interval Training  -  -  No       Treadmill   MPH  -  3  3     Grade  -  1  1     Minutes  -  15  15     METs  -  3.71  3.71       NuStep   Level  -  5  5     SPM  -  80  -     Minutes  -  15  15     METs  -  6.5  6.5       Recumbant Elliptical   Level  -  3  3     RPM  -  50  -     Minutes  -  15  15     METs  -  3  4.4       T5 Nustep   Level  -  -  4     Minutes  -  -  15     METs  -  -  3.5       Home Exercise Plan   Plans to continue exercise at  -  -  Home (comment) walk, gym at work     Frequency  -  -  Add 3 additional days to program exercise sessions.     Initial Home Exercises Provided  -  -  07/12/19        Exercise Comments: Exercise Comments    Row Name 06/22/19 1704           Exercise Comments  First full day of exercise!  Patient was oriented to gym and equipment including functions, settings, policies, and procedures.  Patient's individual exercise prescription and treatment plan were reviewed.  All starting workloads were established based on the results of the 6 minute walk test done at initial orientation visit.  The plan for exercise progression was also introduced and progression will be customized based on patient's performance and goals.          Exercise Goals and Review: Exercise Goals    Row Name 06/20/19 1036             Exercise Goals   Increase Physical Activity  Yes       Intervention  Provide advice, education, support and counseling about physical activity/exercise needs.;Develop an individualized exercise prescription for aerobic and resistive training based on initial evaluation findings, risk  stratification, comorbidities and participant's personal goals.       Expected Outcomes  Short Term:  Attend rehab on a regular basis to increase amount of physical activity.;Long Term: Add in home exercise to make exercise part of routine and to increase amount of physical activity.;Long Term: Exercising regularly at least 3-5 days a week.       Increase Strength and Stamina  Yes       Intervention  Provide advice, education, support and counseling about physical activity/exercise needs.;Develop an individualized exercise prescription for aerobic and resistive training based on initial evaluation findings, risk stratification, comorbidities and participant's personal goals.       Expected Outcomes  Short Term: Increase workloads from initial exercise prescription for resistance, speed, and METs.;Short Term: Perform resistance training exercises routinely during rehab and add in resistance training at home;Long Term: Improve cardiorespiratory fitness, muscular endurance and strength as measured by increased METs and functional capacity (6MWT)       Able to understand and use rate of perceived exertion (RPE) scale  Yes       Intervention  Provide education and explanation on how to use RPE scale       Expected Outcomes  Short Term: Able to use RPE daily in rehab to express subjective intensity level;Long Term:  Able to use RPE to guide intensity level when exercising independently       Able to understand and use Dyspnea scale  Yes       Intervention  Provide education and explanation on how to use Dyspnea scale       Expected Outcomes  Short Term: Able to use Dyspnea scale daily in rehab to express subjective sense of shortness of breath during exertion;Long Term: Able to use Dyspnea scale to guide intensity level when exercising independently       Knowledge and understanding of Target Heart Rate Range (THRR)  Yes       Intervention  Provide education and explanation of THRR including how the numbers were predicted and where they are located for reference       Expected Outcomes  Short Term: Able to  state/look up THRR;Short Term: Able to use daily as guideline for intensity in rehab;Long Term: Able to use THRR to govern intensity when exercising independently       Able to check pulse independently  Yes       Intervention  Provide education and demonstration on how to check pulse in carotid and radial arteries.;Review the importance of being able to check your own pulse for safety during independent exercise       Expected Outcomes  Short Term: Able to explain why pulse checking is important during independent exercise;Long Term: Able to check pulse independently and accurately       Understanding of Exercise Prescription  Yes       Intervention  Provide education, explanation, and written materials on patient's individual exercise prescription       Expected Outcomes  Long Term: Able to explain home exercise prescription to exercise independently;Short Term: Able to explain program exercise prescription          Exercise Goals Re-Evaluation : Exercise Goals Re-Evaluation    Row Name 06/22/19 1707 07/08/19 1255 07/14/19 1436 07/19/19 1450 08/02/19 1158     Exercise Goal Re-Evaluation   Exercise Goals Review  Increase Physical Activity;Able to understand and use rate of perceived exertion (RPE) scale;Knowledge and understanding of Target Heart Rate Range (THRR);Understanding of Exercise Prescription;Increase Strength  and Stamina;Able to check pulse independently  Increase Physical Activity;Increase Strength and Stamina;Able to understand and use rate of perceived exertion (RPE) scale;Knowledge and understanding of Target Heart Rate Range (THRR);Able to check pulse independently;Understanding of Exercise Prescription  Increase Physical Activity;Increase Strength and Stamina;Able to understand and use rate of perceived exertion (RPE) scale;Knowledge and understanding of Target Heart Rate Range (THRR);Able to check pulse independently;Understanding of Exercise Prescription  Increase Physical  Activity;Increase Strength and Stamina;Understanding of Exercise Prescription  -   Comments  Reviewed RPE scale, THR and program prescription with pt today.  Pt voiced understanding and was given a copy of goals to take home.  [name] is doing well in rehab.  He should be able to progress quickly.  Staff will monitor progress  Reviewed home exercise with pt today.  Pt plans to walk and use gym at work for exercise.  Reviewed THR, pulse, RPE, sign and symptoms, NTG use, and when to call 911 or MD.  Also discussed weather considerations and indoor options.  Pt voiced understanding.  [name] is doing well in rehab.  He has recently switched class times and having to work around his work schedule.  He is getting into to class as much as he able.  We will continue to encourage more home exercise and increasing workloads. We will continue to monitor his progress.  Out since last review   Expected Outcomes  Short: Use RPE daily to regulate intensity. Long: Follow program prescription in THR.  Short - continue to attend consistently Long - increase overall MET level  Short - add in home exercise Long - increase stamina  Short: Continue to add in more exercise at home.  Long: Continue to improve stamina.  -      Discharge Exercise Prescription (Final Exercise Prescription Changes): Exercise Prescription Changes - 07/20/19 1400      Response to Exercise   Blood Pressure (Admit)  108/68    Blood Pressure (Exercise)  170/80    Blood Pressure (Exit)  110/62    Heart Rate (Admit)  103 bpm    Heart Rate (Exercise)  126 bpm    Heart Rate (Exit)  103 bpm    Symptoms  none    Duration  Continue with 30 min of aerobic exercise without signs/symptoms of physical distress.    Intensity  THRR unchanged      Progression   Progression  Continue to progress workloads to maintain intensity without signs/symptoms of physical distress.    Average METs  4.87      Resistance Training   Training Prescription  Yes    Weight  4  lbs    Reps  10-15      Interval Training   Interval Training  No      Treadmill   MPH  3    Grade  1    Minutes  15    METs  3.71      NuStep   Level  5    Minutes  15    METs  6.5      Recumbant Elliptical   Level  3    Minutes  15    METs  4.4      T5 Nustep   Level  4    Minutes  15    METs  3.5      Home Exercise Plan   Plans to continue exercise at  Home (comment)   walk, gym at work   Frequency  Add 3 additional days to program exercise sessions.    Initial Home Exercises Provided  07/12/19       Nutrition:  Target Goals: Understanding of nutrition guidelines, daily intake of sodium <1574m, cholesterol <2087m calories 30% from fat and 7% or less from saturated fats, daily to have 5 or more servings of fruits and vegetables.  Biometrics: Pre Biometrics - 06/20/19 1036      Pre Biometrics   Height  5' 5.9" (1.674 m)    Weight  218 lb 1.6 oz (98.9 kg)    BMI (Calculated)  35.3    Single Leg Stand  30 seconds        Nutrition Therapy Plan and Nutrition Goals: Nutrition Therapy & Goals - 06/20/19 1042      Nutrition Therapy   Diet  low Na, HH diet    Drug/Food Interactions  Statins/Certain Fruits   lipitor   Protein (specify units)  80g    Fiber  30 grams    Whole Grain Foods  3 servings    Saturated Fats  12 max. grams    Fruits and Vegetables  5 servings/day    Sodium  1.5 grams      Personal Nutrition Goals   Nutrition Goal  ST: mechanical eating/ small frequent meals while appetite low LT: not worry about heart, be able to walk and not worry about heart    Comments  Pt reports that he stopped the keto diet because it was a fad diet and started it due to a recommendation by his cardiologist for weight loss; encouraged pt that overall heart healthy eating would work best for him in terms of health outcomes especially heart health outcomes and overall sustainability. Pt reports having a low appetitesince heart event and doesn't feel like eating,  discussed mechanical eating and small frequent meals. Pt reports he drinks too much sweet tea and soda - discussed how to incorporate more water (pt gets bored with water). Pt reports not eating fried food and has limited salt intake at home. B (if he has it) is cornflakes with whole milk, L: whole wheat sandwich with tu[place]D: lean protein and vegetables (peas and corn or a salad). Pt reports eating cheeseat least 5x/week, uses mrs dash instead of salt as well as other spices and herbs, and uses egg beaters instead of whole eggs (wife uses whole eggs in cooking) Discussed General HH eating. 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      Intervention Plan   Intervention  Prescribe, educate and counsel regarding individualized specific dietary modifications aiming towards targeted core components such as weight, hypertension, lipid management, diabetes, heart failure and other comorbidities.    Expected Outcomes  Short Term Goal: Understand basic principles of dietary content, such as calories, fat, sodium, cholesterol and nutrients.;Short Term Goal: A plan has been developed with personal nutrition goals set during dietitian appointment.;Long Term Goal: Adherence to prescribed nutrition plan.       Nutrition Assessments: Nutrition Assessments - 06/20/19 1042      Rate Your Plate Scores   Pre Score  35       Nutrition Goals Re-Evaluation:   Nutrition Goals Discharge (Final Nutrition Goals Re-Evaluation):   Psychosocial: Target Goals: Acknowledge presence or absence of significant depression and/or stress, maximize coping skills,  provide positive support system. Participant is able to verbalize types and ability to use techniques and skills needed for reducing stress and depression.   Initial Review & Psychosocial  Screening: Initial Psych Review & Screening - 06/17/19 1007      Initial Review   Current issues with  Current Stress Concerns    Source of Stress Concerns  Family;Occupation    Comments  Owen is currently out of work for about a month due to his NSTEMI. 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 He is staying home with his highschool aged and elementary aged daughters, so helping them with school. He is motivated to work hard and feels less stressed witih this heart attack than with his CABG in 2017.      Family Dynamics   Good Support System?  Yes      Barriers   Psychosocial barriers to participate in program  There are no identifiable barriers or psychosocial needs.      Screening Interventions   Interventions  Encouraged to exercise;To provide support and resources with identified psychosocial needs;Provide feedback about the scores to participant    Expected Outcomes  Short Term goal: Utilizing psychosocial counselor, staff and physician to assist with identification of specific Stressors or current issues interfering with healing process. Setting desired goal for each stressor or current issue identified.;Long Term Goal: Stressors or current issues are controlled or eliminated.;Short Term goal: Identification and review with participant of any Quality of Life or Depression concerns found by scoring the questionnaire.;Long Term goal: The participant improves quality of Life and PHQ9 Scores as seen by post scores and/or verbalization of changes       Quality of Life Scores:   Scores of 19 and below usually indicate a poorer quality of life in these areas.  A difference of  2-3 points is a clinically meaningful difference.  A difference of 2-3 points in the total score of the Quality of Life Index has been associated with significant improvement in overall quality of life, self-image, physical symptoms, and general health in studies assessing change  in quality of life.  PHQ-9: Recent Review Flowsheet Data    Depression screen [facility] 2/9 06/20/2019 12/20/2015   Decreased Interest 0 0   Down, Depressed, Hopeless 0 0   PHQ - 2 Score 0 0   Altered sleeping 0 0   Tired, decreased energy 0 1   Change in appetite 3 2   Feeling bad or failure about yourself  0 0   Trouble concentrating 0 0   Moving slowly or fidgety/restless 0 0   Suicidal thoughts 0 0   PHQ-9 Score 3 3   Difficult doing work/chores Not difficult at all Not difficult at all     Interpretation of Total Score  Total Score Depression Severity:  1-4 = Minimal depression, 5-9 = Mild depression, 10-14 = Moderate depression, 15-19 = Moderately severe depression, 20-27 = Severe depression   Psychosocial Evaluation and Intervention:   Psychosocial Re-Evaluation:   Psychosocial Discharge (Final Psychosocial Re-Evaluation):   Vocational Rehabilitation: Provide vocational rehab assistance to qualifying candidates.   Vocational Rehab Evaluation & Intervention: Vocational Rehab - 06/17/19 1006      Initial Vocational Rehab Evaluation & Intervention   Assessment shows need for Vocational Rehabilitation  No       Education: Education Goals: Education classes will be provided on a variety of topics geared toward better understanding of heart health and  risk factor modification. Participant will state understanding/return demonstration of topics presented as noted by education test scores.  Learning Barriers/Preferences: Learning Barriers/Preferences - 06/17/19 1006      Learning Barriers/Preferences   Learning Barriers  None    Learning Preferences  None       Education Topics:  AED/CPR: - Group verbal and written instruction with the use of models to demonstrate the basic use of the AED with the basic ABC's of resuscitation.   General Nutrition Guidelines/Fats and Fiber: -Group instruction provided by verbal, written material, models and posters to present the  general guidelines for heart healthy nutrition. Gives an explanation and review of dietary fats and fiber.   Controlling Sodium/Reading Food Labels: -Group verbal and written material supporting the discussion of sodium use in heart healthy nutrition. Review and explanation with models, verbal and written materials for utilization of the food label.   Exercise Physiology & General Exercise Guidelines: - Group verbal and written instruction with models to review the exercise physiology of the cardiovascular system and associated critical values. Provides general exercise guidelines with specific guidelines to those with heart or lung disease.    Aerobic Exercise & Resistance Training: - Gives group verbal and written instruction on the various components of exercise. Focuses on aerobic and resistive training programs and the benefits of this training and how to safely progress through these programs..   Flexibility, Balance, Mind/Body Relaxation: Provides group verbal/written instruction on the benefits of flexibility and balance training, including mind/body exercise modes such as yoga, pilates and tai chi.  Demonstration and skill practice provided.   Stress and Anxiety: - Provides group verbal and written instruction about the health risks of elevated stress and causes of high stress.  Discuss the correlation between heart/lung disease and anxiety and treatment options. Review healthy ways to manage with stress and anxiety.   Depression: - Provides group verbal and written instruction on the correlation between heart/lung disease and depressed mood, treatment options, and the stigmas associated with seeking treatment.   Anatomy & Physiology of the Heart: - Group verbal and written instruction and models provide basic cardiac anatomy and physiology, with the coronary electrical and arterial systems. Review of Valvular disease and Heart Failure   Cardiac Procedures: - Group verbal and  written instruction to review commonly prescribed medications for heart disease. Reviews the medication, class of the drug, and side effects. Includes the steps to properly store meds and maintain the prescription regimen. (beta blockers and nitrates)   Cardiac Medications I: - Group verbal and written instruction to review commonly prescribed medications for heart disease. Reviews the medication, class of the drug, and side effects. Includes the steps to properly store meds and maintain the prescription regimen.   Cardiac Medications II: -Group verbal and written instruction to review commonly prescribed medications for heart disease. Reviews the medication, class of the drug, and side effects. (all other drug classes)    Go Sex-Intimacy & Heart Disease, Get SMART - Goal Setting: - Group verbal and written instruction through game format to discuss heart disease and the return to sexual intimacy. Provides group verbal and written material to discuss and apply goal setting through the application of the S.M.A.R.T. Method.   Other Matters of the Heart: - Provides group verbal, written materials and models to describe Stable Angina and Peripheral Artery. Includes description of the disease process and treatment options available to the cardiac patient.   Exercise & Equipment Safety: - Individual verbal instruction and demonstration of  equipment use and safety with use of the equipment.   Cardiac Rehab from 06/20/2019 in [facility] Cardiac and Pulmonary Rehab  Date  06/20/19  Educator  [facility]   Instruction Review Code  1- Verbalizes Understanding      Infection Prevention: - Provides verbal and written material to individual with discussion of infection control including proper hand washing and proper equipment cleaning during exercise session.   Cardiac Rehab from 06/20/2019 in [facility] Cardiac and Pulmonary Rehab  Date  06/20/19  Educator  [facility]  Instruction Review Code  1- Verbalizes Understanding       Falls Prevention: - Provides verbal and written material to individual with discussion of falls prevention and safety.   Cardiac Rehab from 06/20/2019 in [facility] Cardiac and Pulmonary Rehab  Date  06/20/19  Educator  [facility]  Instruction Review Code  1- Verbalizes Understanding      Diabetes: - Individual verbal and written instruction to review signs/symptoms of diabetes, desired ranges of glucose level fasting, after meals and with exercise. Acknowledge that pre and post exercise glucose checks will be done for 3 sessions at entry of program.   Know Your Numbers and Risk Factors: -Group verbal and written instruction about important numbers in your health.  Discussion of what are risk factors and how they play a role in the disease process.  Review of Cholesterol, Blood Pressure, Diabetes, and BMI and the role they play in your overall health.   Sleep Hygiene: -Provides group verbal and written instruction about how sleep can affect your health.  Define sleep hygiene, discuss sleep cycles and impact of sleep habits. Review good sleep hygiene tips.    Other: -Provides group and verbal instruction on various topics (see comments)   Knowledge Questionnaire Score: Knowledge Questionnaire Score - 06/20/19 1048      Knowledge Questionnaire Score   Pre Score  21/26 Education Focus: MI, Heart Failure, PAD, Nutrition, Exercise       Core Components/Risk Factors/Patient Goals at Admission: Personal Goals and Risk Factors at Admission - 06/20/19 1039      Core Components/Risk Factors/Patient Goals on Admission    Weight Management  Yes;Obesity;Weight Loss    Intervention  Weight Management: Develop a combined nutrition and exercise program designed to reach desired caloric intake, while maintaining appropriate intake of nutrient and fiber, sodium and fats, and appropriate energy expenditure required for the weight goal.;Weight Management: Provide education and appropriate resources to help  participant work on and attain dietary goals.;Weight Management/Obesity: Establish reasonable short term and long term weight goals.;Obesity: Provide education and appropriate resources to help participant work on and attain dietary goals.    Admit Weight  218 lb 1.6 oz (98.9 kg)    Goal Weight: Short Term  210 lb (95.3 kg)    Goal Weight: Long Term  200 lb (90.7 kg)    Expected Outcomes  Short Term: Continue to assess and modify interventions until short term weight is achieved;Long Term: Adherence to nutrition and physical activity/exercise program aimed toward attainment of established weight goal;Weight Loss: Understanding of general recommendations for a balanced deficit meal plan, which promotes 1-2 lb weight loss per week and includes a negative energy balance of [phone] kcal/d;Understanding recommendations for meals to include 15-35% energy as protein, 25-35% energy from fat, 35-60% energy from carbohydrates, less than 246m of dietary cholesterol, 20-35 gm of total fiber daily;Understanding of distribution of calorie intake throughout the day with the consumption of 4-5 meals/snacks    Heart Failure  Yes  Intervention  Provide a combined exercise and nutrition program that is supplemented with education, support and counseling about heart failure. Directed toward relieving symptoms such as shortness of breath, decreased exercise tolerance, and extremity edema.    Expected Outcomes  Improve functional capacity of life;Short term: Attendance in program 2-3 days a week with increased exercise capacity. Reported lower sodium intake. Reported increased fruit and vegetable intake. Reports medication compliance.;Short term: Daily weights obtained and reported for increase. Utilizing diuretic protocols set by physician.;Long term: Adoption of self-care skills and reduction of barriers for early signs and symptoms recognition and intervention leading to self-care maintenance.    Hypertension  Yes     Intervention  Provide education on lifestyle modifcations including regular physical activity/exercise, weight management, moderate sodium restriction and increased consumption of fresh fruit, vegetables, and low fat dairy, alcohol moderation, and smoking cessation.;Monitor prescription use compliance.    Expected Outcomes  Short Term: Continued assessment and intervention until BP is < 140/36m HG in hypertensive participants. < 130/829mHG in hypertensive participants with diabetes, heart failure or chronic kidney disease.;Long Term: Maintenance of blood pressure at goal levels.    Lipids  Yes    Intervention  Provide education and support for participant on nutrition & aerobic/resistive exercise along with prescribed medications to achieve LDL <7029mHDL >5m9m  Expected Outcomes  Short Term: Participant states understanding of desired cholesterol values and is compliant with medications prescribed. Participant is following exercise prescription and nutrition guidelines.;Long Term: Cholesterol controlled with medications as prescribed, with individualized exercise RX and with personalized nutrition plan. Value goals: LDL < 70mg89mL > 40 mg.       Core Components/Risk Factors/Patient Goals Review:  Goals and Risk Factor Review    Row Name 07/14/19 1431 07/14/19 1434           Core Components/Risk Factors/Patient Goals Review   Personal Goals Review  Weight Management/Obesity;Hypertension;Lipids  -      Review  [name] iOctavia Bruckneraking meds as directed.  He does check BP at home a couple times a week.  Overall he feels "great" - feels lighter - has lost around 4 lb.  The main change he has made is exercise.  -      Expected Outcomes  -  Short - add in home exercise Long - continue heart healthy habits         Core Components/Risk Factors/Patient Goals at Discharge (Final Review):  Goals and Risk Factor Review - 07/14/19 1434      Core Components/Risk Factors/Patient Goals Review   Expected  Outcomes  Short - add in home exercise Long - continue heart healthy habits       ITP Comments: ITP Comments    Row Name 06/17/19 1018 06/20/19 1029 06/29/19 0618 07/27/19 1230 08/02/19 1159   ITP Comments  Virtual Initial Orientation completed. Diagnosis can be found in CE 8/23. EP/RD Orienation scheduled for 8/31 at 9:30  Completed 6MWT, gym orientation, and RD evaluation. Initial ITP created and sent for review to Dr. Mark Emily Filbertical Director.  30 Day review. Continue with ITP unless directed changes per Medical Director review.  New to program  30 day review completed. ITP sent to Dr. Mark Emily Filbertical Director of Cardiac and Pulmonary Rehab. Continue with ITP unless changes are made by physician.  Department closed starting 10/2 until further notice by infection prevention and Health at Work teams for [facility] to check on pt.  Out since 9/28.  Left  message.   [place] Name 08/18/19 1433 08/24/19 0640         ITP Comments  Pt has not attended since 9/28.  Staff have left messages 3 times  30 day review completed. Continue with ITP sent to Dr. Emily Filbert, Medical Director of Cardiac and Pulmonary Rehab for review , changes as needed and signature.  Last visit 9/28         Comments: Discharge ITP

## 2019-09-24 ENCOUNTER — Emergency Department
Admission: EM | Admit: 2019-09-24 | Discharge: 2019-09-24 | Disposition: A | Discharge status: Home / Self Care | Payer: BC Managed Care – PPO | Attending: Student | Admitting: Student

## 2019-09-24 ENCOUNTER — Other Ambulatory Visit: Payer: Self-pay

## 2019-09-24 DIAGNOSIS — B029 Zoster without complications: Secondary | ICD-10-CM | POA: Diagnosis not present

## 2019-09-24 DIAGNOSIS — I5022 Chronic systolic (congestive) heart failure: Secondary | ICD-10-CM | POA: Insufficient documentation

## 2019-09-24 DIAGNOSIS — R220 Localized swelling, mass and lump, head: Secondary | ICD-10-CM | POA: Diagnosis present

## 2019-09-24 DIAGNOSIS — Z79899 Other long term (current) drug therapy: Secondary | ICD-10-CM | POA: Insufficient documentation

## 2019-09-24 DIAGNOSIS — I252 Old myocardial infarction: Secondary | ICD-10-CM | POA: Diagnosis not present

## 2019-09-24 DIAGNOSIS — Z87891 Personal history of nicotine dependence: Secondary | ICD-10-CM | POA: Diagnosis not present

## 2019-09-24 DIAGNOSIS — Z7982 Long term (current) use of aspirin: Secondary | ICD-10-CM | POA: Insufficient documentation

## 2019-09-24 DIAGNOSIS — Z951 Presence of aortocoronary bypass graft: Secondary | ICD-10-CM | POA: Diagnosis not present

## 2019-09-24 DIAGNOSIS — I2510 Atherosclerotic heart disease of native coronary artery without angina pectoris: Secondary | ICD-10-CM | POA: Diagnosis not present

## 2019-09-24 DIAGNOSIS — I110 Hypertensive heart disease with heart failure: Secondary | ICD-10-CM | POA: Diagnosis not present

## 2019-09-24 MED ORDER — TRAMADOL HCL 50 MG PO TABS: 50.0000 mg | ORAL_TABLET | Freq: Four times a day (QID) | ORAL | 0 refills | Status: AC | PRN

## 2019-09-24 MED ORDER — ACYCLOVIR 400 MG PO TABS: 400.0000 mg | ORAL_TABLET | Freq: Every day | ORAL | 0 refills | Status: AC

## 2019-09-24 NOTE — ED Triage Notes (Signed)
Patient reports having "knots" that came up on his head that are painful.  Reports noticed them yesterday evening.

## 2019-09-24 NOTE — ED Provider Notes (Signed)
[facility] Emergency Department Provider Note   ____________________________________________   First MD Initiated Contact with Patient 09/24/19 [phone]     (approximate)  I have reviewed the triage vital signs and the nursing notes.   HISTORY  Chief Complaint "Knots on Scalp"    HPI Rene Paciimothy E Plummer is a 43 y.o. male patient presents with 2 days of vesicular lesions lateral forehead.  Patient states there is a "stinging/burning sensation.  Patient denies fever chills associated complaint.  Rates the pain as a 4/10.  No palliative measure for complaint.         Past Medical History:  Diagnosis Date  . CHF (congestive heart failure) (HCC)   . Coronary artery disease   . High cholesterol   . Hypertension   . ST elevation (STEMI) myocardial infarction involving left anterior descending coronary artery [facility](HCC) Jan 2016  . Tobacco use     Patient Active Problem List   Diagnosis Date Noted  . Hypercholesteremia 06/14/2019  . Ischemic cardiomyopathy 06/12/2019  . Obstructive sleep apnea syndrome 12/10/2017  . DVT of axillary vein, acute left (HCC) 10/17/2015  . NSTEMI (non-ST elevated myocardial infarction) (HCC) 10/07/2015  . CHF (congestive heart failure), NYHA class I, chronic, systolic (HCC) 10/07/2015  . Coronary artery disease involving native coronary artery of native heart without angina pectoris 10/31/2014  . Essential hypertension 08/12/2014  . Former tobacco use 08/12/2014  . History of hyperlipidemia 08/12/2014    Past Surgical History:  Procedure Laterality Date  . APPENDECTOMY    . cardiac bi pass  10/11/2015  . CORONARY ANGIOPLASTY WITH STENT PLACEMENT    . CORONARY ARTERY BYPASS GRAFT      Prior to Admission medications   Medication Sig Start Date End Date Taking? Authorizing Provider  acyclovir (ZOVIRAX) 400 MG tablet Take 1 tablet (400 mg total) by mouth 5 (five) times daily for 10 days. 09/24/19 10/04/19  Joni ReiningSmith, Liani Caris K, PA-C   amiodarone (PACERONE) 200 MG tablet Take 1 tablet by mouth daily. 10/15/15   [provider]  aspirin EC 81 MG tablet Take 81 mg by mouth daily.    [provider]  atorvastatin (LIPITOR) 80 MG tablet Take 1 tablet by mouth daily. 10/15/15   [provider]  Evolocumab (REPATHA) 140 MG/ML SOSY Inject into the skin. 06/13/19   [provider]  furosemide (LASIX) 20 MG tablet Take 1 tablet by mouth daily. 10/15/15   [provider]  gabapentin (NEURONTIN) 100 MG capsule Take 1 capsule by mouth 3 (three) times daily. 10/15/15   [provider]  lisinopril (ZESTRIL) 20 MG tablet Take by mouth.  11/13/15 06/17/19  [provider]  metoprolol succinate (TOPROL-XL) 25 MG 24 hr tablet Take 1 tablet by mouth daily.  10/15/15   [provider]  nitroGLYCERIN (NITROSTAT) 0.4 MG SL tablet Place under the tongue. 06/14/19 06/13/20  [provider]  oxyCODONE (OXY IR/ROXICODONE) 5 MG immediate release tablet Take 1 tablet by mouth every 4 (four) hours as needed.  10/15/15   [provider]  prasugrel (EFFIENT) 10 MG TABS tablet Take by mouth. 06/15/19 06/14/20  [provider]  traMADol (ULTRAM) 50 MG tablet Take 1 tablet (50 mg total) by mouth every 6 (six) hours as needed. 09/24/19 09/23/20  Joni ReiningSmith, Danijela Vessey K, PA-C    Allergies Patient has no known allergies.  No family history on file.  Social History Social History   Tobacco Use  . Smoking status: Former Smoker  Packs/day: 1.50    Years: 20.00    Pack years: 30.00    Types: Cigarettes    Quit date: 10/05/2015    Years since quitting: 3.9  . Smokeless tobacco: Never Used  Substance Use Topics  . Alcohol use: Yes    Alcohol/week: 0.0 standard drinks    Comment: occasional  . Drug use: Not on file    Review of Systems Constitutional: No fever/chills Eyes: No visual changes. ENT: No sore throat. Cardiovascular: Denies chest pain. Respiratory:  Denies shortness of breath. Gastrointestinal: No abdominal pain.  No nausea, no vomiting.  No diarrhea.  No constipation. Genitourinary: Negative for dysuria. Musculoskeletal: Negative for back pain. Skin: Positive for rash. Neurological: Negative for headaches, focal weakness or numbness. Endocrine:  Hypertension and hyperlipidemia. ____________________________________________   PHYSICAL EXAM:  VITAL SIGNS: ED Triage Vitals  Enc Vitals Group     BP 09/24/19 0403 (!) 173/88     Pulse Rate 09/24/19 0403 85     Resp 09/24/19 0403 18     Temp 09/24/19 0403 97.9 F (36.6 C)     Temp Source 09/24/19 0403 Oral     SpO2 09/24/19 0403 97 %     Weight 09/24/19 0404 215 lb (97.5 kg)     Height 09/24/19 0404 5\' 6"  (1.676 m)     Head Circumference --      Peak Flow --      Pain Score 09/24/19 0404 4     Pain Loc --      Pain Edu? --      Excl. in GC? --    Constitutional: Alert and oriented. Well appearing and in no acute distress. Cardiovascular: Normal rate, regular rhythm. Grossly normal heart sounds.  Good peripheral circulation. Respiratory: Normal respiratory effort.  No retractions. Lungs CTAB. Neurologic:  Normal speech and language. No gross focal neurologic deficits are appreciated. No gait instability. Skin:  Skin is warm, dry and intact.  Vesicular lesions left lateral forehead. Psychiatric: Mood and affect are normal. Speech and behavior are normal.  ____________________________________________   LABS (all labs ordered are listed, but only abnormal results are displayed)  Labs Reviewed - No data to display ____________________________________________  EKG   ____________________________________________  RADIOLOGY  ED MD interpretation:    Official radiology report(s): No results found.  ____________________________________________   PROCEDURES  Procedure(s) performed (including Critical Care):  Procedures    ____________________________________________   INITIAL IMPRESSION / ASSESSMENT AND PLAN / ED COURSE  As part of my medical decision making, I reviewed the following data within the electronic MEDICAL RECORD NUMBER     Patient presents with painful vascular lesion left lateral forehead.  Physical exam consistent with herpes zoster.  Patient given discharge care instruction advised take medication as directed.  Patient advised [facility] with [facility].    TYMEER VAQUERA was evaluated in Emergency Department on 09/24/2019 for the symptoms described in the history of present illness. He was evaluated in the context of the global COVID-19 pandemic, which necessitated consideration that the patient might be at risk for infection with the SARS-CoV-2 virus that causes COVID-19. Institutional protocols and algorithms that pertain to the evaluation of patients at risk for COVID-19 are in a state of rapid change based on information released by regulatory bodies including the CDC and federal and state organizations. These policies and algorithms were followed during the patient's care in the ED.       ____________________________________________   FINAL CLINICAL IMPRESSION(S) / ED DIAGNOSES  Final diagnoses:  Herpes zoster without complication     ED Discharge Orders         Ordered    traMADol (ULTRAM) 50 MG tablet  Every 6 hours PRN     09/24/19 0723    acyclovir (ZOVIRAX) 400 MG tablet  5 times daily     09/24/19 8546           Note:  This document was prepared using Dragon voice recognition software and may include unintentional dictation errors.    Sable Feil, PA-C 09/24/19 2703    Lilia Pro., MD 09/24/19 [phone]

## 2019-09-24 NOTE — ED Notes (Signed)
See triage note  Presents with a rash to left side of forehead  And also states he noticed some sore areas to back of head

## 2020-12-23 ENCOUNTER — Emergency Department: Payer: BC Managed Care – PPO

## 2020-12-23 ENCOUNTER — Encounter: Payer: Self-pay | Admitting: Intensive Care

## 2020-12-23 ENCOUNTER — Other Ambulatory Visit: Payer: Self-pay

## 2020-12-23 ENCOUNTER — Inpatient Hospital Stay
Admission: EM | Admit: 2020-12-23 | Discharge: 2020-12-26 | DRG: 281 | Disposition: A | Discharge status: Other Acute Inpatient Hospital | Payer: BC Managed Care – PPO | Attending: Obstetrics and Gynecology | Admitting: Obstetrics and Gynecology

## 2020-12-23 DIAGNOSIS — E785 Hyperlipidemia, unspecified: Secondary | ICD-10-CM | POA: Diagnosis present

## 2020-12-23 DIAGNOSIS — T82855A Stenosis of coronary artery stent, initial encounter: Secondary | ICD-10-CM | POA: Diagnosis present

## 2020-12-23 DIAGNOSIS — Z8249 Family history of ischemic heart disease and other diseases of the circulatory system: Secondary | ICD-10-CM | POA: Diagnosis not present

## 2020-12-23 DIAGNOSIS — E669 Obesity, unspecified: Secondary | ICD-10-CM | POA: Diagnosis present

## 2020-12-23 DIAGNOSIS — Z951 Presence of aortocoronary bypass graft: Secondary | ICD-10-CM

## 2020-12-23 DIAGNOSIS — Z79899 Other long term (current) drug therapy: Secondary | ICD-10-CM

## 2020-12-23 DIAGNOSIS — E7800 Pure hypercholesterolemia, unspecified: Secondary | ICD-10-CM | POA: Diagnosis present

## 2020-12-23 DIAGNOSIS — Z20822 Contact with and (suspected) exposure to covid-19: Secondary | ICD-10-CM | POA: Diagnosis present

## 2020-12-23 DIAGNOSIS — I2510 Atherosclerotic heart disease of native coronary artery without angina pectoris: Secondary | ICD-10-CM | POA: Diagnosis present

## 2020-12-23 DIAGNOSIS — G4733 Obstructive sleep apnea (adult) (pediatric): Secondary | ICD-10-CM | POA: Diagnosis present

## 2020-12-23 DIAGNOSIS — Z7982 Long term (current) use of aspirin: Secondary | ICD-10-CM

## 2020-12-23 DIAGNOSIS — I110 Hypertensive heart disease with heart failure: Secondary | ICD-10-CM | POA: Diagnosis present

## 2020-12-23 DIAGNOSIS — I252 Old myocardial infarction: Secondary | ICD-10-CM | POA: Diagnosis not present

## 2020-12-23 DIAGNOSIS — K219 Gastro-esophageal reflux disease without esophagitis: Secondary | ICD-10-CM | POA: Diagnosis present

## 2020-12-23 DIAGNOSIS — Z955 Presence of coronary angioplasty implant and graft: Secondary | ICD-10-CM

## 2020-12-23 DIAGNOSIS — I214 Non-ST elevation (NSTEMI) myocardial infarction: Secondary | ICD-10-CM | POA: Diagnosis present

## 2020-12-23 DIAGNOSIS — Z87891 Personal history of nicotine dependence: Secondary | ICD-10-CM | POA: Diagnosis not present

## 2020-12-23 DIAGNOSIS — Y831 Surgical operation with implant of artificial internal device as the cause of abnormal reaction of the patient, or of later complication, without mention of misadventure at the time of the procedure: Secondary | ICD-10-CM | POA: Diagnosis present

## 2020-12-23 DIAGNOSIS — I5032 Chronic diastolic (congestive) heart failure: Secondary | ICD-10-CM | POA: Diagnosis present

## 2020-12-23 DIAGNOSIS — Z86718 Personal history of other venous thrombosis and embolism: Secondary | ICD-10-CM | POA: Diagnosis not present

## 2020-12-23 LAB — CBC
HCT: 47.1 % (ref 39.0–52.0)
Hemoglobin: 16 g/dL (ref 13.0–17.0)
MCH: 31.1 pg (ref 26.0–34.0)
MCHC: 34 g/dL (ref 30.0–36.0)
MCV: 91.6 fL (ref 80.0–100.0)
Platelets: 253 10*3/uL (ref 150–400)
RBC: 5.14 MIL/uL (ref 4.22–5.81)
RDW: 13.4 % (ref 11.5–15.5)
WBC: 4.9 10*3/uL (ref 4.0–10.5)
nRBC: 0 % (ref 0.0–0.2)

## 2020-12-23 LAB — BASIC METABOLIC PANEL
Anion gap: 9 (ref 5–15)
BUN: 14 mg/dL (ref 6–20)
CO2: 23 mmol/L (ref 22–32)
Calcium: 8.7 mg/dL — ABNORMAL LOW (ref 8.9–10.3)
Chloride: 103 mmol/L (ref 98–111)
Creatinine, Ser: 0.89 mg/dL (ref 0.61–1.24)
GFR, Estimated: >60 mL/min (ref >60)
Glucose, Bld: 151 mg/dL — ABNORMAL HIGH (ref 70–99)
Potassium: 4.2 mmol/L (ref 3.5–5.1)
Sodium: 135 mmol/L (ref 135–145)

## 2020-12-23 LAB — RESP PANEL BY RT-PCR (FLU A&B, COVID) ARPGX2
Influenza A by PCR: NEGATIVE
Influenza B by PCR: NEGATIVE
SARS Coronavirus 2 by RT PCR: NEGATIVE

## 2020-12-23 LAB — TSH: TSH: 1.245 u[IU]/mL (ref 0.350–4.500)

## 2020-12-23 LAB — TROPONIN I (HIGH SENSITIVITY)
Troponin I (High Sensitivity): 267 ng/L (ref <18)
Troponin I (High Sensitivity): 447 ng/L (ref <18)
Troponin I (High Sensitivity): 1480 ng/L (ref <18)

## 2020-12-23 LAB — GLUCOSE, CAPILLARY: Glucose-Capillary: 118 mg/dL — ABNORMAL HIGH (ref 70–99)

## 2020-12-23 LAB — PROTIME-INR
INR: 1 (ref 0.8–1.2)
Prothrombin Time: 12.6 seconds (ref 11.4–15.2)

## 2020-12-23 LAB — APTT: aPTT: 32 seconds (ref 24–36)

## 2020-12-23 MED ORDER — MORPHINE SULFATE (PF) 2 MG/ML IV SOLN
2.0000 mg | Freq: Once | INTRAVENOUS | Status: AC
  Administered 2020-12-23: 2 mg via INTRAVENOUS
  Filled 2020-12-23: qty 1

## 2020-12-23 MED ORDER — ASPIRIN EC 81 MG PO TBEC
81.0000 mg | DELAYED_RELEASE_TABLET | Freq: Every day | ORAL | Status: DC
  Administered 2020-12-24 – 2020-12-25 (×2): 81 mg via ORAL
  Filled 2020-12-23 (×2): qty 1

## 2020-12-23 MED ORDER — AMLODIPINE BESYLATE 5 MG PO TABS
5.0000 mg | ORAL_TABLET | Freq: Every day | ORAL | Status: DC
  Administered 2020-12-24: 5 mg via ORAL
  Filled 2020-12-23: qty 1

## 2020-12-23 MED ORDER — SODIUM CHLORIDE 0.9 % IV SOLN: INTRAVENOUS | Status: DC

## 2020-12-23 MED ORDER — PANTOPRAZOLE SODIUM 40 MG PO TBEC
40.0000 mg | DELAYED_RELEASE_TABLET | Freq: Every day | ORAL | Status: DC
  Filled 2020-12-23: qty 1

## 2020-12-23 MED ORDER — HEPARIN BOLUS VIA INFUSION
4000.0000 [IU] | Freq: Once | INTRAVENOUS | Status: AC
  Administered 2020-12-23: 4000 [IU] via INTRAVENOUS
  Filled 2020-12-23: qty 4000

## 2020-12-23 MED ORDER — ONDANSETRON HCL 4 MG/2ML IJ SOLN: 4.0000 mg | Freq: Four times a day (QID) | INTRAMUSCULAR | Status: DC | PRN

## 2020-12-23 MED ORDER — NITROGLYCERIN 0.4 MG SL SUBL: 0.4000 mg | SUBLINGUAL_TABLET | SUBLINGUAL | Status: DC | PRN

## 2020-12-23 MED ORDER — ONDANSETRON HCL 4 MG/2ML IJ SOLN
4.0000 mg | Freq: Once | INTRAMUSCULAR | Status: AC
  Administered 2020-12-23: 4 mg via INTRAVENOUS
  Filled 2020-12-23: qty 2

## 2020-12-23 MED ORDER — HEPARIN SODIUM (PORCINE) 5000 UNIT/ML IJ SOLN
4000.0000 [IU] | Freq: Once | INTRAMUSCULAR | Status: DC
  Filled 2020-12-23: qty 1

## 2020-12-23 MED ORDER — ATORVASTATIN CALCIUM 80 MG PO TABS
80.0000 mg | ORAL_TABLET | Freq: Every day | ORAL | Status: DC
  Administered 2020-12-24 – 2020-12-25 (×2): 80 mg via ORAL
  Filled 2020-12-23 (×2): qty 4

## 2020-12-23 MED ORDER — NITROGLYCERIN 0.4 MG SL SUBL
0.4000 mg | SUBLINGUAL_TABLET | Freq: Once | SUBLINGUAL | Status: AC
  Administered 2020-12-23: 0.4 mg via SUBLINGUAL
  Filled 2020-12-23: qty 1

## 2020-12-23 MED ORDER — ASPIRIN 81 MG PO CHEW
324.0000 mg | CHEWABLE_TABLET | Freq: Once | ORAL | Status: AC
  Administered 2020-12-23: 324 mg via ORAL
  Filled 2020-12-23: qty 4

## 2020-12-23 MED ORDER — HEPARIN (PORCINE) 25000 UT/250ML-% IV SOLN
1400.0000 [IU]/h | INTRAVENOUS | Status: DC
  Administered 2020-12-23: 20:00:00 1100 [IU]/h via INTRAVENOUS
  Administered 2020-12-24 – 2020-12-25 (×3): 1400 [IU]/h via INTRAVENOUS
  Filled 2020-12-23 (×4): qty 250

## 2020-12-23 MED ORDER — PANTOPRAZOLE SODIUM 20 MG PO TBEC
20.0000 mg | DELAYED_RELEASE_TABLET | Freq: Every day | ORAL | Status: DC
  Administered 2020-12-24 – 2020-12-25 (×2): 20 mg via ORAL
  Filled 2020-12-23 (×3): qty 1

## 2020-12-23 MED ORDER — SODIUM CHLORIDE 0.9 % IV SOLN: INTRAVENOUS | Status: AC

## 2020-12-23 MED ORDER — LOSARTAN POTASSIUM 50 MG PO TABS
100.0000 mg | ORAL_TABLET | Freq: Every day | ORAL | Status: DC
  Administered 2020-12-24 – 2020-12-25 (×2): 100 mg via ORAL
  Filled 2020-12-23 (×2): qty 2

## 2020-12-23 MED ORDER — RANOLAZINE ER 500 MG PO TB12
500.0000 mg | ORAL_TABLET | Freq: Two times a day (BID) | ORAL | Status: DC
  Administered 2020-12-23 – 2020-12-24 (×2): 500 mg via ORAL
  Filled 2020-12-23 (×2): qty 1

## 2020-12-23 MED ORDER — METOPROLOL SUCCINATE ER 50 MG PO TB24
75.0000 mg | ORAL_TABLET | Freq: Every day | ORAL | Status: DC
  Administered 2020-12-24: 75 mg via ORAL
  Filled 2020-12-23: qty 2

## 2020-12-23 MED ORDER — PRASUGREL HCL 10 MG PO TABS
10.0000 mg | ORAL_TABLET | Freq: Every day | ORAL | Status: DC
  Administered 2020-12-24: 10 mg via ORAL
  Filled 2020-12-23: qty 1

## 2020-12-23 MED ORDER — ISOSORBIDE MONONITRATE ER 30 MG PO TB24
30.0000 mg | ORAL_TABLET | Freq: Every day | ORAL | Status: DC
  Administered 2020-12-24 – 2020-12-25 (×2): 30 mg via ORAL
  Filled 2020-12-23 (×2): qty 1

## 2020-12-23 MED ORDER — ACETAMINOPHEN 325 MG PO TABS: 650.0000 mg | ORAL_TABLET | ORAL | Status: DC | PRN

## 2020-12-23 NOTE — Plan of Care (Signed)

## 2020-12-23 NOTE — ED Provider Notes (Signed)
[facility] Emergency Department Provider Note   ____________________________________________    I have reviewed the triage vital signs and the nursing notes.   HISTORY  Chief Complaint Chest Pain     HPI Gregg [name] is a 45 y.o. male with history of CHF, CAD, CABG who presents with complaints of chest pain.  Patient reports that approximately 530 today he developed pressure-like substernal chest pain which has continued until just prior to being seen by me.  He reports he took nitroglycerin x2 at home with little improvement.  He does take daily Imdur.  He does see [facility]ology at [employer].  Extensive past medical history of heart disease as noted above.  No fevers chills or cough.  No shortness of breath  Past Medical History:  Diagnosis Date  . CHF (congestive heart failure) (HCC)   . Coronary artery disease   . High cholesterol   . Hypertension   . ST elevation (STEMI) myocardial infarction involving left anterior descending coronary artery [facility]) Jan 2016  . Tobacco use     Patient Active Problem List   Diagnosis Date Noted  . Hypercholesteremia 06/14/2019  . Ischemic cardiomyopathy 06/12/2019  . Obstructive sleep apnea syndrome 12/10/2017  . DVT of axillary vein, acute left (HCC) 10/17/2015  . NSTEMI (non-ST elevated myocardial infarction) (HCC) 10/07/2015  . CHF (congestive heart failure), NYHA class I, chronic, systolic (HCC) 10/07/2015  . Coronary artery disease involving native coronary artery of native heart without angina pectoris 10/31/2014  . Essential hypertension 08/12/2014  . Former tobacco use 08/12/2014  . History of hyperlipidemia 08/12/2014    Past Surgical History:  Procedure Laterality Date  . APPENDECTOMY    . cardiac bi pass  10/11/2015  . CORONARY ANGIOPLASTY WITH STENT PLACEMENT    . CORONARY ARTERY BYPASS GRAFT      Prior to Admission medications   Medication Sig Start Date End Date Taking?  Authorizing Provider  amiodarone (PACERONE) 200 MG tablet Take 1 tablet by mouth daily. 10/15/15   [provider]  aspirin EC 81 MG tablet Take 81 mg by mouth daily.    [provider]  atorvastatin (LIPITOR) 80 MG tablet Take 1 tablet by mouth daily. 10/15/15   [provider]  Evolocumab (REPATHA) 140 MG/ML SOSY Inject into the skin. 06/13/19   [provider]  furosemide (LASIX) 20 MG tablet Take 1 tablet by mouth daily. 10/15/15   [provider]  gabapentin (NEURONTIN) 100 MG capsule Take 1 capsule by mouth 3 (three) times daily. 10/15/15   [provider]  lisinopril (ZESTRIL) 20 MG tablet Take by mouth.  11/13/15 06/17/19  [provider]  metoprolol succinate (TOPROL-XL) 25 MG 24 hr tablet Take 1 tablet by mouth daily.  10/15/15   [provider]  oxyCODONE (OXY IR/ROXICODONE) 5 MG immediate release tablet Take 1 tablet by mouth every 4 (four) hours as needed.  10/15/15   [provider]     Allergies Patient has no known allergies.  History reviewed. No pertinent family history.  Social History Social History   Tobacco Use  . Smoking status: Former Smoker    Packs/day: 1.50    Years: 20.00    Pack years: 30.00    Types: Cigarettes    Quit date: 10/05/2015    Years since quitting: 5.2  . Smokeless tobacco: Never Used  Substance Use Topics  . Alcohol use: Yes    Alcohol/week: 0.0 standard drinks  Comment: occasional  . Drug use: Never    Review of Systems  Constitutional: No fever/chills Eyes: No visual changes.  ENT: No sore throat. Cardiovascular: As above Respiratory: Denies shortness of breath. Gastrointestinal: No abdominal pain.  No nausea, no vomiting.   Genitourinary: Negative for dysuria. Musculoskeletal: Negative for back pain. Skin: Negative for rash. Neurological: Negative for headaches or weakness   ____________________________________________   PHYSICAL  EXAM:  VITAL SIGNS: ED Triage Vitals  Enc Vitals Group     BP 12/23/20 1845 (!) 143/91     Pulse Rate 12/23/20 1845 88     Resp 12/23/20 1845 18     Temp 12/23/20 1845 97.9 F (36.6 C)     Temp Source 12/23/20 1845 Oral     SpO2 12/23/20 1845 97 %     Weight 12/23/20 1856 101.6 kg (224 lb)     Height 12/23/20 1856 1.676 m (5\' 6" )     Head Circumference --      Peak Flow --      Pain Score 12/23/20 1856 6     Pain Loc --      Pain Edu? --      Excl. in GC? --     Constitutional: Alert and oriented.  Eyes: Conjunctivae are normal.   Nose: No congestion/rhinnorhea. Mouth/Throat: Mucous membranes are moist.    Cardiovascular: Normal rate, regular rhythm.   Good peripheral circulation. Respiratory: Normal respiratory effort.  No retractions. Lungs CTAB. Gastrointestinal: Soft and nontender. No distention.    Musculoskeletal: Mild lower extremity edema bilaterally, warm and well perfused Neurologic:  Normal speech and language. No gross focal neurologic deficits are appreciated.  Skin:  Skin is warm, dry and intact. No rash noted. Psychiatric: Mood and affect are normal. Speech and behavior are normal.  ____________________________________________   LABS (all labs ordered are listed, but only abnormal results are displayed)  Labs Reviewed  BASIC METABOLIC PANEL - Abnormal; Notable for the following components:      Result Value   Glucose, Bld 151 (*)    Calcium 8.7 (*)    All other components within normal limits  TROPONIN I (HIGH SENSITIVITY) - Abnormal; Notable for the following components:   Troponin I (High Sensitivity) 267 (*)    All other components within normal limits  RESP PANEL BY RT-PCR (FLU A&B, COVID) ARPGX2  CBC  APTT  PROTIME-INR   ____________________________________________  EKG  ED ECG REPORT I, 02/22/21, the attending physician, personally viewed and interpreted this ECG.  Date: 12/23/2020  Rhythm: normal sinus rhythm QRS Axis:  normal Intervals: normal ST/T Wave abnormalities: Nonspecific ST changes   ____________________________________________  RADIOLOGY  Chest x-ray viewed by me, no infiltrate or effusion ____________________________________________   PROCEDURES  Procedure(s) performed: No  Procedures   Critical Care performed: yes  CRITICAL CARE Performed by: 02/22/2021   Total critical care time: 30 minutes  Critical care time was exclusive of separately billable procedures and treating other patients.  Critical care was necessary to treat or prevent imminent or life-threatening deterioration.  Critical care was time spent personally by me on the following activities: development of treatment plan with patient and/or surrogate as well as nursing, discussions with consultants, evaluation of patient's response to treatment, examination of patient, obtaining history from patient or surrogate, ordering and performing treatments and interventions, ordering and review of laboratory studies, ordering and review of radiographic studies, pulse oximetry and re-evaluation of patient's condition.  ____________________________________________   INITIAL IMPRESSION / ASSESSMENT  AND PLAN / ED COURSE  Pertinent labs & imaging results that were available during my care of the patient were reviewed by me and considered in my medical decision making (see chart for details).  Patient presents with chest pain as above, EKG with nonspecific ST changes.  Given history of CAD concerning presentation for ACS today.  Notified of critical lab value troponin of 267.  Is consistent with an NSTEMI.  We will start the patient on heparin drip, aspirin given, morphine as needed for pain, nitro as needed for pain.  I discussed with the hospitalist for admission    ____________________________________________   FINAL CLINICAL IMPRESSION(S) / ED DIAGNOSES  Final diagnoses:  NSTEMI (non-ST elevated myocardial  infarction) [facility])        Note:  This document was prepared using Dragon voice recognition software and may include unintentional dictation errors.   Jene Every, MD 12/23/20 [phone]

## 2020-12-23 NOTE — Consult Note (Addendum)
ANTICOAGULATION CONSULT NOTE   Pharmacy Consult for heparin Indication: chest pain/ACS  No Known Allergies  Patient Measurements: Height: 5\' 6"  (167.6 cm) Weight: 101.6 kg (224 lb) IBW/kg (Calculated) : 63.8 Heparin Dosing Weight: 86.3  Vital Signs: Temp: 97.9 F (36.6 C) (03/06 1845) Temp Source: Oral (03/06 1845) BP: 143/91 (03/06 1845) Pulse Rate: 88 (03/06 1845)  Labs: Recent Labs    12/23/20 1850  HGB 16.0  HCT 47.1  PLT 253  CREATININE 0.89  TROPONINIHS 267*    Estimated Creatinine Clearance: 115.7 mL/min (by C-G formula based on SCr of 0.89 mg/dL).   Medical History: Past Medical History:  Diagnosis Date  . CHF (congestive heart failure) (HCC)   . Coronary artery disease   . High cholesterol   . Hypertension   . ST elevation (STEMI) myocardial infarction involving left anterior descending coronary artery [facility]) Jan 2016  . Tobacco use     Medications:  No PTA APT or AC  Assessment: 45 y.o. male with history of CHF, CAD, CABG who presents with complaints of chest pain. He reports he took nitroglycerin x2 at home with little improvement. EKG with nonspecific ST changes and  troponin of 267. Pharmacy has been consulted for heparin dosing.  Heparin Dosing Weight: 86.3 H/H and plts WNL  Goal of Therapy:  Heparin level 0.3-0.7 units/ml Monitor platelets by anticoagulation protocol: Yes   Plan:  Give 4000 units bolus x 1 Start heparin infusion at 1100 units/hr Check anti-Xa level in 6 hours and daily while on heparin Continue to monitor H&H and platelets  47, PharmD 12/23/2020,7:52 PM

## 2020-12-23 NOTE — ED Triage Notes (Signed)
Patient c/o central chest pain that radiates to back and bilateral arms that started around 5:30pm today

## 2020-12-23 NOTE — H&P (Addendum)
History and Physical    Gregg [name] FTD:[id number] DOB: 1976/04/27 DOA: 12/23/2020  PCP: Pcp, No  Patient coming from: home  I have personally briefly reviewed patient's old medical records in [facility]  Chief Complaint: chest pain  HPI: Gregg [name] is a 45 y.o. male with medical history significant of prior tobacco abuse HTN, HLD, DVT axillary vein, OSA ,ST elevation MI involving left anterior descending (LAD) coronary artery 12/11/2014 and subsequent multivessel CABG, followed by NSTEMI 06/12/2019 with PCI/Stents 2x DES placed to LCx and OM, stable exertional angina controlled by long acting nitrates and ranexa, CHF last EF 50%, followed by Dr Lars Pinks at [facility], who presents to ed with chest pain that started 5:30 pm day of presentation. Pain is described as substernal pressure that radiates to back and b/l arms. It is worse with exertion. It is associated with    Sob,mild presyncope,mild epigastric pain. It is was not relieved by SL nitro x2 at home so he presented to ED. He also noted over the last 4 days he has had cold symptoms with congestion and cough. He states his chest pain started after he took medicine for his congestion.  He does note over the last month he has had increasing episodes of angina but notes that they would be relieved by SL. He denies any n/v/d /fever or chills.    ED Course:  Afeb, bp 143/91,rr18, hr 88 sat 97% on ra  Labs: Wbc:4.9,hgb 16,plt 253, NA:135, K4.2 glu:141 cr 0.89 CE 267 Cxr:NAD CWC:BJSEG, minimal st depression/repolarization abnormality no hyperacute findings Review of Systems: As per HPI otherwise 10 point review of systems negative.   Past Medical History:  Diagnosis Date  . CHF (congestive heart failure) (HCC)   . Coronary artery disease   . High cholesterol   . Hypertension   . ST elevation (STEMI) myocardial infarction involving left anterior descending coronary artery [facility]) Jan 2016  . Tobacco use     Past Surgical  History:  Procedure Laterality Date  . APPENDECTOMY    . cardiac bi pass  10/11/2015  . CORONARY ANGIOPLASTY WITH STENT PLACEMENT    . CORONARY ARTERY BYPASS GRAFT       reports that he quit smoking about 5 years ago. His smoking use included cigarettes. He has a 30.00 pack-year smoking history. He has never used smokeless tobacco. He reports current alcohol use. He reports that he does not use drugs.  No Known Allergies  Family History  Father with premature CAD,COPD Mother with CAD    CURRENT MEDICATIONS: taken from last cardiology note Current Outpatient Medications  Medication Sig Dispense Refill  . amLODIPine (NORVASC) 5 MG tablet Take 1 tablet (5 mg total) by mouth once daily 30 tablet 11  . aspirin 81 MG EC tablet Take 81 mg by mouth once daily.  Marland Kitchen atorvastatin (LIPITOR) 80 MG tablet TAKE 1 TABLET BY MOUTH EVERY DAY 30 tablet 11  . FUROsemide (LASIX) 20 MG tablet Take 1 tablet (20 mg total) by mouth once daily 30 tablet 0  . isosorbide mononitrate (IMDUR) 30 MG ER tablet Take 1 tablet (30 mg total) by mouth once daily 30 tablet 11  . losartan (COZAAR) 100 MG tablet Take 1 tablet (100 mg total) by mouth once daily REPLACES LISINOPRIL 30 tablet 11  . omeprazole (PRILOSEC) 20 MG DR capsule Take 1 capsule (20 mg total) by mouth once daily 30 capsule 11  . ranolazine (RANEXA) 500 MG ER tablet Take 1 tablet (  500 mg total) by mouth 2 (two) times daily 60 tablet 11  . REPATHA SURECLICK 140 mg/mL PnIj INJECT 140 MG SUBCUTANEOUSLY EVERY 14 (FOURTEEN) DAYS 2 mL 11  . metoprolol succinate (TOPROL-XL) 25 MG XL tablet Take 1 tablet (25 mg total) by mouth once daily 90 tablet 3  . nitroGLYcerin (NITROSTAT) 0.4 MG SL tablet Place 1 tablet (0.4 mg total) under the tongue every 5 (five) minutes as needed for Chest pain May take up to 3 doses. 25 tablet 6  . prasugreL (EFFIENT) 10 mg tablet Take 1 tablet (10 mg total) by mouth once daily 30 tablet 11      Prior to Admission medications    Medication Sig Start Date End Date Taking? Authorizing Provider  amiodarone (PACERONE) 200 MG tablet Take 1 tablet by mouth daily. 10/15/15   [provider]  aspirin EC 81 MG tablet Take 81 mg by mouth daily.    [provider]  atorvastatin (LIPITOR) 80 MG tablet Take 1 tablet by mouth daily. 10/15/15   [provider]  Evolocumab (REPATHA) 140 MG/ML SOSY Inject into the skin. 06/13/19   [provider]  furosemide (LASIX) 20 MG tablet Take 1 tablet by mouth daily. 10/15/15   [provider]  gabapentin (NEURONTIN) 100 MG capsule Take 1 capsule by mouth 3 (three) times daily. 10/15/15   [provider]  lisinopril (ZESTRIL) 20 MG tablet Take by mouth.  11/13/15 06/17/19  [provider]  metoprolol succinate (TOPROL-XL) 25 MG 24 hr tablet Take 1 tablet by mouth daily.  10/15/15   [provider]  oxyCODONE (OXY IR/ROXICODONE) 5 MG immediate release tablet Take 1 tablet by mouth every 4 (four) hours as needed.  10/15/15   [provider]    Physical Exam: Vitals:   12/23/20 1845 12/23/20 1856  BP: (!) 143/91   Pulse: 88   Resp: 18   Temp: 97.9 F (36.6 C)   TempSrc: Oral   SpO2: 97%   Weight:  101.6 kg  Height:  5\' 6"  (1.676 m)     Vitals:   12/23/20 1845 12/23/20 1856  BP: (!) 143/91   Pulse: 88   Resp: 18   Temp: 97.9 F (36.6 C)   TempSrc: Oral   SpO2: 97%   Weight:  101.6 kg  Height:  5\' 6"  (1.676 m)  Constitutional: NAD, calm, comfortable Eyes: PERRL, lids and conjunctivae normal ENMT: Mucous membranes are moist. Posterior pharynx clear of any exudate or lesions.Normal dentition.  Neck: normal, supple, no masses, no thyromegaly Respiratory: clear to auscultation bilaterally, no wheezing, no crackles. Normal respiratory effort. No accessory muscle use.  Cardiovascular: Regular rate and rhythm, no murmurs / rubs / gallops. No extremity edema. 2+ pedal pulses. No carotid bruits.  Abdomen: no  tenderness, no masses palpated. No hepatosplenomegaly. Bowel sounds positive.  Musculoskeletal: no clubbing / cyanosis. No joint deformity upper and lower extremities. Good ROM, no contractures. Normal muscle tone.  Skin: no rashes, lesions, ulcers. No induration Neurologic: CN 2-12 grossly intact. Sensation intact Strength 5/5 in all 4.  Psychiatric: Normal judgment and insight. Alert and oriented x 3. Normal mood.    Labs on Admission: I have personally reviewed following labs and imaging studies  CBC: Recent Labs  Lab 12/23/20 1850  WBC 4.9  HGB 16.0  HCT 47.1  MCV 91.6  PLT 253   Basic Metabolic Panel: Recent Labs  Lab 12/23/20 1850  NA 135  K 4.2  CL 103  CO2 23  GLUCOSE 151*  BUN 14  CREATININE 0.89  CALCIUM 8.7*   GFR: Estimated Creatinine Clearance: 115.7 mL/min (by C-G formula based on SCr of 0.89 mg/dL). Liver Function Tests: No results for input(s): AST, ALT, ALKPHOS, BILITOT, PROT, ALBUMIN in the last 168 hours. No results for input(s): LIPASE, AMYLASE in the last 168 hours. No results for input(s): AMMONIA in the last 168 hours. Coagulation Profile: No results for input(s): INR, PROTIME in the last 168 hours. Cardiac Enzymes: No results for input(s): CKTOTAL, CKMB, CKMBINDEX, TROPONINI in the last 168 hours. BNP (last 3 results) No results for input(s): PROBNP in the last 8760 hours. HbA1C: No results for input(s): HGBA1C in the last 72 hours. CBG: No results for input(s): GLUCAP in the last 168 hours. Lipid Profile: No results for input(s): CHOL, HDL, LDLCALC, TRIG, CHOLHDL, LDLDIRECT in the last 72 hours. Thyroid Function Tests: No results for input(s): TSH, T4TOTAL, FREET4, T3FREE, THYROIDAB in the last 72 hours. Anemia Panel: No results for input(s): VITAMINB12, FOLATE, FERRITIN, TIBC, IRON, RETICCTPCT in the last 72 hours. Urine analysis:    Component Value Date/Time   COLORURINE YELLOW (A) 10/16/2015 1343   APPEARANCEUR CLEAR (A) 10/16/2015  1343   LABSPEC 1.042 (H) 10/16/2015 1343   PHURINE 6.0 10/16/2015 1343   GLUCOSEU NEGATIVE 10/16/2015 1343   HGBUR NEGATIVE 10/16/2015 1343   BILIRUBINUR NEGATIVE 10/16/2015 1343   KETONESUR NEGATIVE 10/16/2015 1343   PROTEINUR NEGATIVE 10/16/2015 1343   NITRITE NEGATIVE 10/16/2015 1343   LEUKOCYTESUR NEGATIVE 10/16/2015 1343    Radiological Exams on Admission: No results found.  EKG: Independently reviewed. See above  Assessment/Plan CAD s/p CABG/s/p pci now with NSTEMI -admit to progressive care -place on NSTEMI protocol -resume home cardiac medications effient,statin,repatha, imdur,ranexa,bb -will continue heparin drip initiated in ED -echo /cardiology consult in am  -npo midnight for possible cardiac intervention -monitor CE per protocol  -currently chest pain free  HTN -currently stable  -cont bb, arb,amlodipine  HLD -continue statin,repatha  Ischemic CMY with recovered ef -post event ef 40% ,last ef 50% -patient previously on lasix, not currently taken at this time -patient remains euvolemic   OSA -cpap qhs  Hx of DVT axillary vien -s/p treatment    GERD -ppi     DVT prophylaxis: Heparin Code Status: Full Family Communication:N/A Disposition Plan:patient  expected to be admitted greater than 2 midnights Consults called: cardiology [place] Admission status: progressive/cardiac   Lurline Del MD [facility]  If 7PM-7AM, please contact night-coverage [email or web address] Password [email or web address]  12/23/2020, 7:49 PM

## 2020-12-24 DIAGNOSIS — I214 Non-ST elevation (NSTEMI) myocardial infarction: Secondary | ICD-10-CM | POA: Diagnosis not present

## 2020-12-24 LAB — CBC
HCT: 43.9 % (ref 39.0–52.0)
Hemoglobin: 14.7 g/dL (ref 13.0–17.0)
MCH: 30.9 pg (ref 26.0–34.0)
MCHC: 33.5 g/dL (ref 30.0–36.0)
MCV: 92.4 fL (ref 80.0–100.0)
Platelets: 235 10*3/uL (ref 150–400)
RBC: 4.75 MIL/uL (ref 4.22–5.81)
RDW: 13.3 % (ref 11.5–15.5)
WBC: 5 10*3/uL (ref 4.0–10.5)
nRBC: 0 % (ref 0.0–0.2)

## 2020-12-24 LAB — BASIC METABOLIC PANEL
Anion gap: 5 (ref 5–15)
BUN: 12 mg/dL (ref 6–20)
CO2: 24 mmol/L (ref 22–32)
Calcium: 8.4 mg/dL — ABNORMAL LOW (ref 8.9–10.3)
Chloride: 108 mmol/L (ref 98–111)
Creatinine, Ser: 0.79 mg/dL (ref 0.61–1.24)
GFR, Estimated: >60 mL/min (ref >60)
Glucose, Bld: 88 mg/dL (ref 70–99)
Potassium: 4.1 mmol/L (ref 3.5–5.1)
Sodium: 137 mmol/L (ref 135–145)

## 2020-12-24 LAB — LIPID PANEL
Cholesterol: 39 mg/dL (ref 0–200)
HDL: 19 mg/dL — ABNORMAL LOW (ref >40)
LDL Cholesterol: 0 mg/dL (ref 0–99)
Total CHOL/HDL Ratio: 2.1 RATIO
Triglycerides: 99 mg/dL (ref <150)
VLDL: 20 mg/dL (ref 0–40)

## 2020-12-24 LAB — HEPARIN LEVEL (UNFRACTIONATED)
Heparin Unfractionated: 0.13 IU/mL — ABNORMAL LOW (ref 0.30–0.70)
Heparin Unfractionated: 0.44 IU/mL (ref 0.30–0.70)
Heparin Unfractionated: 0.41 IU/mL (ref 0.30–0.70)

## 2020-12-24 LAB — HEMOGLOBIN A1C
Hgb A1c MFr Bld: 6.1 % — ABNORMAL HIGH (ref 4.8–5.6)
Mean Plasma Glucose: 128.37 mg/dL

## 2020-12-24 LAB — PROTIME-INR
INR: 1 (ref 0.8–1.2)
Prothrombin Time: 13 seconds (ref 11.4–15.2)

## 2020-12-24 LAB — HIV ANTIBODY (ROUTINE TESTING W REFLEX): HIV Screen 4th Generation wRfx: NONREACTIVE

## 2020-12-24 LAB — TROPONIN I (HIGH SENSITIVITY): Troponin I (High Sensitivity): 2822 ng/L (ref <18)

## 2020-12-24 MED ORDER — AMLODIPINE BESYLATE 5 MG PO TABS
5.0000 mg | ORAL_TABLET | Freq: Every day | ORAL | Status: DC
  Administered 2020-12-25: 5 mg via ORAL
  Filled 2020-12-24: qty 1

## 2020-12-24 MED ORDER — HEPARIN BOLUS VIA INFUSION
2600.0000 [IU] | Freq: Once | INTRAVENOUS | Status: AC
  Administered 2020-12-24: 2600 [IU] via INTRAVENOUS
  Filled 2020-12-24: qty 2600

## 2020-12-24 MED ORDER — ISOSORBIDE MONONITRATE ER 30 MG PO TB24: 30.0000 mg | ORAL_TABLET | Freq: Every day | ORAL | Status: DC

## 2020-12-24 MED ORDER — ATORVASTATIN CALCIUM 80 MG PO TABS: 80.0000 mg | ORAL_TABLET | Freq: Every day | ORAL | Status: DC

## 2020-12-24 MED ORDER — METOPROLOL SUCCINATE ER 25 MG PO TB24
25.0000 mg | ORAL_TABLET | Freq: Every day | ORAL | Status: DC
Start: 2020-12-25 — End: 2020-12-26
  Administered 2020-12-25: 25 mg via ORAL
  Filled 2020-12-24: qty 1

## 2020-12-24 MED ORDER — ASPIRIN EC 81 MG PO TBEC: 81.0000 mg | DELAYED_RELEASE_TABLET | Freq: Every day | ORAL | Status: DC

## 2020-12-24 MED ORDER — LOSARTAN POTASSIUM 50 MG PO TABS: 100.0000 mg | ORAL_TABLET | Freq: Every day | ORAL | Status: DC

## 2020-12-24 NOTE — Consult Note (Addendum)
CARDIOLOGY CONSULT NOTE               Patient ID: Gregg [name] MRN: [id number] DOB/AGE: 01/23/1976 45 y.o.  Admit date: 12/23/2020 Referring Physician Dr Maisie Fus hospitalist Primary Physician none Primary Cardiologist Dr. Naomie Dean [facility] Reason for Consultation non-STEMI  HPI: Patient is 45 year old white male multiple medical problems including previous STEMI in 2016 resulting in PCI and stent to LAD and subsequent coronary bypass surgery x3 LIMA LAD vein graft to circumflex vein graft to RCA the patient subsequently had a repeat cath for non-STEMI unstable angina and August 2020 record receiving a stent to the circumflex his vein graft to the circumflex was occluded patient has hypertension hyperlipidemia previous DVT in the axillary vein obstructive sleep apnea obesity former smoker last ejection fraction was around 50% patient last seen by Dr. Lars Pinks at [facility] in 2020 now presents with unstable anginal symptoms since yesterday peak troponins were close to 3000 patient is now pain-free resting comfortably.  Echocardiogram is pending other laboratories are unremarkable he is on a heparin drip.  After discussion about the need for cardiac catheter is my recommendation he prefers to have it done at [facility] so arrangements will be made for transfer to have cath performed within the next 24 to 48 hours  Review of systems complete and found to be negative unless listed above     Past Medical History:  Diagnosis Date  . CHF (congestive heart failure) (HCC)   . Coronary artery disease   . High cholesterol   . Hypertension   . ST elevation (STEMI) myocardial infarction involving left anterior descending coronary artery [facility]) Jan 2016  . Tobacco use     Past Surgical History:  Procedure Laterality Date  . APPENDECTOMY    . cardiac bi pass  10/11/2015  . CORONARY ANGIOPLASTY WITH STENT PLACEMENT    . CORONARY ARTERY BYPASS GRAFT      Medications Prior to Admission  Medication Sig  Dispense Refill Last Dose  . amLODipine (NORVASC) 5 MG tablet Take 5 mg by mouth daily.   Past Week at Unknown time  . aspirin EC 81 MG tablet Take 81 mg by mouth daily.   Past Week at Unknown time  . atorvastatin (LIPITOR) 80 MG tablet Take 1 tablet by mouth daily.   Past Week at Unknown time  . Evolocumab (REPATHA) 140 MG/ML SOSY Inject into the skin.   Past Week at Unknown time  . isosorbide mononitrate (IMDUR) 30 MG 24 hr tablet Take 1 tablet by mouth daily.   Past Week at Unknown time  . losartan (COZAAR) 100 MG tablet Take 100 mg by mouth daily.   Past Week at Unknown time  . metoprolol succinate (TOPROL-XL) 25 MG 24 hr tablet Take 1 tablet by mouth daily.    Past Week at Unknown time  . prasugrel (EFFIENT) 10 MG TABS tablet Take 10 mg by mouth daily.   Past Week at Unknown time  . nitroGLYCERIN (NITROSTAT) 0.4 MG SL tablet Place 1 tablet under the tongue daily as needed.   PRN   Social History   Socioeconomic History  . Marital status: Married    Spouse name: Not on file  . Number of children: Not on file  . Years of education: Not on file  . Highest education level: Not on file  Occupational History  . Not on file  Tobacco Use  . Smoking status: Former Smoker    Packs/day: 1.50    Years: 20.00  Pack years: 30.00    Types: Cigarettes    Quit date: 10/05/2015    Years since quitting: 5.2  . Smokeless tobacco: Never Used  Substance and Sexual Activity  . Alcohol use: Yes    Alcohol/week: 0.0 standard drinks    Comment: occasional  . Drug use: Never  . Sexual activity: Not on file  Other Topics Concern  . Not on file  Social History Narrative  . Not on file   Social Determinants of Health   Financial Resource Strain: Not on file  Food Insecurity: Not on file  Transportation Needs: Not on file  Physical Activity: Not on file  Stress: Not on file  Social Connections: Not on file  Intimate Partner Violence: Not on file    History reviewed. No pertinent family  history.    Review of systems complete and found to be negative unless listed above      PHYSICAL EXAM  General: Well developed, well nourished, in no acute distress HEENT:  Normocephalic and atramatic Neck:  No JVD.  Lungs: Clear bilaterally to auscultation and percussion. Heart: HRRR . Normal S1 and S2 without gallops or murmurs.  Abdomen: Bowel sounds are positive, abdomen soft and non-tender  Msk:  Back normal, normal gait. Normal strength and tone for age. Extremities: No clubbing, cyanosis or edema.   Neuro: Alert and oriented X 3. Psych:  Good affect, responds appropriately  Labs:   Lab Results  Component Value Date   WBC 5.0 12/24/2020   HGB 14.7 12/24/2020   HCT 43.9 12/24/2020   MCV 92.4 12/24/2020   PLT 235 12/24/2020    Recent Labs  Lab 12/24/20 0217  NA 137  K 4.1  CL 108  CO2 24  BUN 12  CREATININE 0.79  CALCIUM 8.4*  GLUCOSE 88   Lab Results  Component Value Date   TROPONINI 0.57 (H) 10/16/2015    Lab Results  Component Value Date   CHOL 39 12/24/2020   Lab Results  Component Value Date   HDL 19 (L) 12/24/2020   Lab Results  Component Value Date   LDLCALC 0 12/24/2020   Lab Results  Component Value Date   TRIG 99 12/24/2020   Lab Results  Component Value Date   CHOLHDL 2.1 12/24/2020   No results found for: LDLDIRECT    Radiology: DG Chest 2 View  Result Date: 12/23/2020 CLINICAL DATA:  Chest pain. EXAM: CHEST - 2 VIEW COMPARISON:  Radiograph and CT [address]00 FINDINGS: Post median sternotomy. Upper normal heart size, stable. Coronary stent is visualized. Normal mediastinal contours. No pulmonary edema, focal airspace disease, pleural effusion or pneumothorax. No acute osseous abnormalities are seen. IMPRESSION: No acute chest findings. Electronically Signed   By: Narda Rutherford M.D.   On: 12/23/2020 19:48    EKG: ` Normal sinus rhythm nonspecific STTW 75  ASSESSMENT AND PLAN:  Non-STEMI Multivessel coronary disease History of  coronary bypass surgery History of PCI and stent Hyperlipidemia Obesity Obstructive sleep apnea Former smoker Hypertension History of DVT axillary vein . Plan Agree with admit to telemetry with anticoagulation with IV heparin Follow-up troponins have risen to close to 3000 Patient is pain-free stable continue medical therapy Agree with echocardiogram for assessment of wall motion left ventricular function valvular structures Recommend cardiac cath prior to discharge within the next 24 to 48 hours Agree with continued therapy for obstructive sleep apnea with CPAP Hypertension control with beta-blocker ARB Continue blood pressure control Antianginals with imdur nitrates beta-blocker ACE inhibitor  or ARB and heparin Arrangements are being made for transfer to [facility] Dr. Karl Ito , CCU fellow is excepted the patient in transfer we are waiting bed.  It may be tomorrow  Signed: Alwyn Pea MD 12/24/2020, 12:30 PM

## 2020-12-24 NOTE — Consult Note (Signed)
ANTICOAGULATION CONSULT NOTE   Pharmacy Consult for heparin Indication: chest pain/ACS  No Known Allergies  Patient Measurements: Height: 5\' 6"  (167.6 cm) Weight: 103.2 kg (227 lb 8.2 oz) IBW/kg (Calculated) : 63.8 Heparin Dosing Weight: 86.3  Vital Signs: Temp: 98.5 F (36.9 C) (03/07 0841) Temp Source: Oral (03/07 0407) BP: 124/84 (03/07 0841) Pulse Rate: 79 (03/07 0841)  Labs: Recent Labs    12/23/20 1850 12/23/20 2006 12/23/20 2100 12/23/20 2306 12/24/20 0217 12/24/20 0948  HGB 16.0  --   --   --  14.7  --   HCT 47.1  --   --   --  43.9  --   PLT 253  --   --   --  235  --   APTT  --  32  --   --   --   --   LABPROT  --  12.6  --   --  13.0  --   INR  --  1.0  --   --  1.0  --   HEPARINUNFRC  --   --   --   --  0.13* 0.44  CREATININE 0.89  --   --   --  0.79  --   TROPONINIHS 267*  --  447* 1,480*  --   --     Estimated Creatinine Clearance: 129.9 mL/min (by C-G formula based on SCr of 0.79 mg/dL).   Medical History: Past Medical History:  Diagnosis Date  . CHF (congestive heart failure) (HCC)   . Coronary artery disease   . High cholesterol   . Hypertension   . ST elevation (STEMI) myocardial infarction involving left anterior descending coronary artery [facility]) Jan 2016  . Tobacco use     Medications:  No PTA APT or AC  Assessment: 45 y.o. male with history of CHF, CAD, CABG who presents with complaints of chest pain. He reports he took nitroglycerin x2 at home with little improvement. EKG with nonspecific ST changes and  troponin of 267. Pharmacy has been consulted for heparin dosing.  Heparin Dosing Weight: 86.3  3/7 0217 HL 0.13  3/7 0948 HL 0.44   Goal of Therapy:  Heparin level 0.3-0.7 units/ml Monitor platelets by anticoagulation protocol: Yes   Plan:  Heparin level is therapeutic. Will continue current heparin infusion at 1400 units/hr. Recheck HL in 6 hours. CBC daily while on heparin.    5/7, PharmD, BCPS 12/24/2020,10:21  AM

## 2020-12-24 NOTE — Plan of Care (Signed)

## 2020-12-24 NOTE — Consult Note (Signed)
ANTICOAGULATION CONSULT NOTE   Pharmacy Consult for heparin Indication: chest pain/ACS  No Known Allergies  Patient Measurements: Height: 5\' 6"  (167.6 cm) Weight: 103.1 kg (227 lb 4.8 oz) IBW/kg (Calculated) : 63.8 Heparin Dosing Weight: 86.3  Vital Signs: Temp: 98.5 F (36.9 C) (03/06 2152) Temp Source: Oral (03/06 2152) BP: 126/84 (03/06 2152) Pulse Rate: 67 (03/06 2152)  Labs: Recent Labs    12/23/20 1850 12/23/20 2006 12/23/20 2100 12/23/20 2306 12/24/20 0217  HGB 16.0  --   --   --  14.7  HCT 47.1  --   --   --  43.9  PLT 253  --   --   --  235  APTT  --  32  --   --   --   LABPROT  --  12.6  --   --  13.0  INR  --  1.0  --   --  1.0  HEPARINUNFRC  --   --   --   --  0.13*  CREATININE 0.89  --   --   --  0.79  TROPONINIHS 267*  --  447* 1,480*  --     Estimated Creatinine Clearance: 129.7 mL/min (by C-G formula based on SCr of 0.79 mg/dL).   Medical History: Past Medical History:  Diagnosis Date  . CHF (congestive heart failure) (HCC)   . Coronary artery disease   . High cholesterol   . Hypertension   . ST elevation (STEMI) myocardial infarction involving left anterior descending coronary artery [facility]) Jan 2016  . Tobacco use     Medications:  No PTA APT or AC  Assessment: 45 y.o. male with history of CHF, CAD, CABG who presents with complaints of chest pain. He reports he took nitroglycerin x2 at home with little improvement. EKG with nonspecific ST changes and  troponin of 267. Pharmacy has been consulted for heparin dosing.  Heparin Dosing Weight: 86.3 H/H and plts WNL  Goal of Therapy:  Heparin level 0.3-0.7 units/ml Monitor platelets by anticoagulation protocol: Yes   Plan:  3/7:  HL @ 0217 = 0.13 Will order Heparin 2600 units IV X 1 bolus and increase drip rate to 1400 units/hr. Will recheck HL 6 hrs after rate change.   Raylie Maddison D, PharmD 12/24/2020,3:48 AM

## 2020-12-24 NOTE — Progress Notes (Signed)
PROGRESS NOTE    Gregg [name]  ACZ:[id number] DOB: 1976/09/26 DOA: 12/23/2020 PCP: Patient, No Pcp Per  Outpatient Specialists: [facility]    Brief Narrative:  From admission hpi Gregg [name] is a 45 y.o. male with medical history significant of prior tobacco abuse HTN, HLD, DVT axillary vein, OSA ,ST elevation MI involving left anterior descending (LAD) coronary artery 12/11/2014 and subsequent multivessel CABG, followed by NSTEMI 06/12/2019 with PCI/Stents 2x DES placed to LCx and OM, stable exertional angina controlled by long acting nitrates and ranexa, CHF last EF 50%, followed by Dr Lars Pinks at [facility], who presents to ed with chest pain that started 5:30 pm day of presentation. Pain is described as substernal pressure that radiates to back and b/l arms. It is worse with exertion. It is associated with    Sob,mild presyncope,mild epigastric pain. It is was not relieved by SL nitro x2 at home so he presented to ED. He also noted over the last 4 days he has had cold symptoms with congestion and cough. He states his chest pain started after he took medicine for his congestion.  He does note over the last month he has had increasing episodes of angina but notes that they would be relieved by SL. He denies any n/v/d /fever or chills.   Assessment & Plan:   Active Problems:   NSTEMI (non-ST elevated myocardial infarction) (HCC)  # NSTEMI # CAD # HFpEF S/p stemi with multi-vessel cabg in 2015, nstemi with PCI and DES x2 in 2020, here with chest pain similar in quality to prior MIs and troponin elevation to 1480. Started on heparin, received asa, currently chest pain free, no dyspnea, cxr clear. EF 50% on 2020 TTE. - continue heparin and aspirin and losartan and atorvastatin, hold home effient - cont home metop and imdur - also on repatha q 2 wks at home - cardiology consulted - maintain telemetry - npo - tte ordered  # HTN Here bp wnl - cont home amlodipine, metoprolol,  losartan, imdur  # OSA - cpap qhs  # History provoked DVT S/p treatment    DVT prophylaxis: therapeutic heparin Code Status: full Family Communication:  None @ bedside  Level of care: Progressive Cardiac Status is: Inpatient  Remains inpatient appropriate because:Inpatient level of care appropriate due to severity of illness   Dispo: The patient is from: Home              Anticipated d/c is to: Home              Patient currently is not medically stable to d/c.   Difficult to place patient No    Consultants:  cardiology  Procedures: none  Antimicrobials:  none    Subjective: This morning chest pain resolved, no dyspnea. No cough. Has appetite, no abd pain, no n/v/d.   Objective: Vitals:   12/23/20 2152 12/24/20 0407 12/24/20 0409 12/24/20 0841  BP: 126/84 124/83  124/84  Pulse: 67 68  79  Resp: 17 18  18   Temp: 98.5 F (36.9 C) 98.6 F (37 C)  98.5 F (36.9 C)  TempSrc: Oral Oral    SpO2: 98% 98%  95%  Weight: 103.1 kg  103.2 kg   Height: 5\' 6"  (1.676 m)       Intake/Output Summary (Last 24 hours) at 12/24/2020 0915 Last data filed at 12/24/2020 0300 Gross per 24 hour  Intake 481.99 ml  Output 0 ml  Net 481.99 ml   02/23/2021  12/23/20 1856 12/23/20 2152 12/24/20 0409  Weight: 101.6 kg 103.1 kg 103.2 kg    Examination:  General exam: Appears calm and comfortable  Respiratory system: Clear to auscultation. Respiratory effort normal. Cardiovascular system: S1 & S2 heard, RRR. No JVD, murmurs, rubs, gallops or clicks. No pedal edema. Gastrointestinal system: Abdomen is nondistended, soft and nontender. No organomegaly or masses felt. Normal bowel sounds heard. Central nervous system: Alert and oriented. No focal neurological deficits. Extremities: Symmetric 5 x 5 power. Skin: No rashes, lesions or ulcers Psychiatry: Judgement and insight appear normal. Mood & affect appropriate.     Data Reviewed: I have personally reviewed following  labs and imaging studies  CBC: Recent Labs  Lab 12/23/20 1850 12/24/20 0217  WBC 4.9 5.0  HGB 16.0 14.7  HCT 47.1 43.9  MCV 91.6 92.4  PLT 253 235   Basic Metabolic Panel: Recent Labs  Lab 12/23/20 1850 12/24/20 0217  NA 135 137  K 4.2 4.1  CL 103 108  CO2 23 24  GLUCOSE 151* 88  BUN 14 12  CREATININE 0.89 0.79  CALCIUM 8.7* 8.4*   GFR: Estimated Creatinine Clearance: 129.9 mL/min (by C-G formula based on SCr of 0.79 mg/dL). Liver Function Tests: No results for input(s): AST, ALT, ALKPHOS, BILITOT, PROT, ALBUMIN in the last 168 hours. No results for input(s): LIPASE, AMYLASE in the last 168 hours. No results for input(s): AMMONIA in the last 168 hours. Coagulation Profile: Recent Labs  Lab 12/23/20 2006 12/24/20 0217  INR 1.0 1.0   Cardiac Enzymes: No results for input(s): CKTOTAL, CKMB, CKMBINDEX, TROPONINI in the last 168 hours. BNP (last 3 results) No results for input(s): PROBNP in the last 8760 hours. HbA1C: Recent Labs    12/23/20 2100  HGBA1C 6.1*   CBG: Recent Labs  Lab 12/23/20 2222  GLUCAP 118*   Lipid Profile: Recent Labs    12/24/20 0217  CHOL 39  HDL 19*  LDLCALC 0  TRIG 99  CHOLHDL 2.1   Thyroid Function Tests: Recent Labs    12/23/20 2100  TSH 1.245   Anemia Panel: No results for input(s): VITAMINB12, FOLATE, FERRITIN, TIBC, IRON, RETICCTPCT in the last 72 hours. Urine analysis:    Component Value Date/Time   COLORURINE YELLOW (A) 10/16/2015 1343   APPEARANCEUR CLEAR (A) 10/16/2015 1343   LABSPEC 1.042 (H) 10/16/2015 1343   PHURINE 6.0 10/16/2015 1343   GLUCOSEU NEGATIVE 10/16/2015 1343   HGBUR NEGATIVE 10/16/2015 1343   BILIRUBINUR NEGATIVE 10/16/2015 1343   KETONESUR NEGATIVE 10/16/2015 1343   PROTEINUR NEGATIVE 10/16/2015 1343   NITRITE NEGATIVE 10/16/2015 1343   LEUKOCYTESUR NEGATIVE 10/16/2015 1343   Sepsis Labs: @LABRCNTIP (procalcitonin:4,lacticidven:4)  ) Recent Results (from the past 240 hour(s))   Resp Panel by RT-PCR (Flu A&B, Covid) Nasopharyngeal Swab     Status: None   Collection Time: 12/23/20  8:06 PM   Specimen: Nasopharyngeal Swab; Nasopharyngeal(NP) swabs in vial transport medium  Result Value Ref Range Status   SARS Coronavirus 2 by RT PCR NEGATIVE NEGATIVE Final    Comment: (NOTE) SARS-CoV-2 target nucleic acids are NOT DETECTED.  The SARS-CoV-2 RNA is generally detectable in upper respiratory specimens during the acute phase of infection. The lowest concentration of SARS-CoV-2 viral copies this assay can detect is 138 copies/mL. A negative result does not preclude SARS-Cov-2 infection and should not be used as the sole basis for treatment or other patient management decisions. A negative result may occur with  improper specimen collection/handling, submission of specimen other  than nasopharyngeal swab, presence of viral mutation(s) within the areas targeted by this assay, and inadequate number of viral copies(<138 copies/mL). A negative result must be combined with clinical observations, patient history, and epidemiological information. The expected result is Negative.  Fact Sheet for Patients:  BloggerCourse.com  Fact Sheet for Healthcare Providers:  SeriousBroker.it  This test is no t yet approved or cleared by the [place] FDA and  has been authorized for detection and/or diagnosis of SARS-CoV-2 by FDA under an Emergency Use Authorization (EUA). This EUA will remain  in effect (meaning this test can be used) for the duration of the COVID-19 declaration under Section 564(b)(1) of the Act, 21 U.S.C.section 360bbb-3(b)(1), unless the authorization is terminated  or revoked sooner.       Influenza A by PCR NEGATIVE NEGATIVE Final   Influenza B by PCR NEGATIVE NEGATIVE Final    Comment: (NOTE) The Xpert Xpress SARS-CoV-2/FLU/RSV plus assay is intended as an aid in the diagnosis of influenza from  Nasopharyngeal swab specimens and should not be used as a sole basis for treatment. Nasal washings and aspirates are unacceptable for Xpert Xpress SARS-CoV-2/FLU/RSV testing.  Fact Sheet for Patients: BloggerCourse.com  Fact Sheet for Healthcare Providers: SeriousBroker.it  This test is not yet approved or cleared by the [place] FDA and has been authorized for detection and/or diagnosis of SARS-CoV-2 by FDA under an Emergency Use Authorization (EUA). This EUA will remain in effect (meaning this test can be used) for the duration of the COVID-19 declaration under Section 564(b)(1) of the Act, 21 U.S.C. section 360bbb-3(b)(1), unless the authorization is terminated or revoked.  Performed at [facility], [address], [place], [place] [address]          Radiology Studies: DG Chest 2 View  Result Date: 12/23/2020 CLINICAL DATA:  Chest pain. EXAM: CHEST - 2 VIEW COMPARISON:  [place]adiograph [place] [address] FINDINGS: Post median sternotomy. Upper normal heart size, stable. Coronary stent is visualized. Normal mediastinal contours. No pulmonary edema, focal airspace disease, pleural effusion or pneumothorax. No acute osseous abnormalities are seen. IMPRESSION: No acute chest findings. Electronically Signed   By: Narda Rutherford M.D.   On: 12/23/2020 19:48        Scheduled Meds: . amLODipine  5 mg Oral Daily  . aspirin EC  81 mg Oral Daily  . atorvastatin  80 mg Oral Daily  . isosorbide mononitrate  30 mg Oral Daily  . losartan  100 mg Oral Daily  . metoprolol succinate  75 mg Oral Daily  . pantoprazole  20 mg Oral Daily  . prasugrel  10 mg Oral Daily  . ranolazine  500 mg Oral BID   Continuous Infusions: . heparin 1,400 Units/hr (12/24/20 0404)     LOS: 1 day    Time spent: 40 min    Silvano Bilis, MD [facility]   If 7PM-7AM, please contact night-coverage [email or web address] Password [email or web address] 12/24/2020,  9:15 AM

## 2020-12-24 NOTE — Consult Note (Signed)
ANTICOAGULATION CONSULT NOTE   Pharmacy Consult for heparin Indication: chest pain/ACS  No Known Allergies  Patient Measurements: Height: 5\' 6"  (167.6 cm) Weight: 103.2 kg (227 lb 8.2 oz) IBW/kg (Calculated) : 63.8 Heparin Dosing Weight: 86.3  Vital Signs: Temp: 98.8 F (37.1 C) (03/07 1209) BP: 109/72 (03/07 1209) Pulse Rate: 65 (03/07 1209)  Labs: Recent Labs    12/23/20 1850 12/23/20 2006 12/23/20 2100 12/23/20 2306 12/24/20 0217 12/24/20 0948 12/24/20 1542  HGB 16.0  --   --   --  14.7  --   --   HCT 47.1  --   --   --  43.9  --   --   PLT 253  --   --   --  235  --   --   APTT  --  32  --   --   --   --   --   LABPROT  --  12.6  --   --  13.0  --   --   INR  --  1.0  --   --  1.0  --   --   HEPARINUNFRC  --   --   --   --  0.13* 0.44 0.41  CREATININE 0.89  --   --   --  0.79  --   --   TROPONINIHS 267*  --  447* 1,480*  --  2,822*  --     Estimated Creatinine Clearance: 129.9 mL/min (by C-G formula based on SCr of 0.79 mg/dL).   Medical History: Past Medical History:  Diagnosis Date  . CHF (congestive heart failure) (HCC)   . Coronary artery disease   . High cholesterol   . Hypertension   . ST elevation (STEMI) myocardial infarction involving left anterior descending coronary artery [facility]) Jan 2016  . Tobacco use     Medications:  No PTA APT or AC  Assessment: 45 y.o. male with history of CHF, CAD, CABG who presents with complaints of chest pain. He reports he took nitroglycerin x2 at home with little improvement. EKG with nonspecific ST changes and  troponin of 267. Pharmacy has been consulted for heparin dosing.  Heparin Dosing Weight: 86.3  3/7 0217 HL 0.13  3/7 0948 HL 0.44  3/7 1542 HL 0.41  Goal of Therapy:  Heparin level 0.3-0.7 units/ml Monitor platelets by anticoagulation protocol: Yes   Plan:  Heparin level is therapeutic. Will continue current heparin infusion at 1400 units/hr. Recheck HL in with AM labs. CBC daily while on heparin.     5/7, PharmD, BCPS 12/24/2020,4:29 PM

## 2020-12-25 ENCOUNTER — Inpatient Hospital Stay
Admission: EM | Admit: 2020-12-25 | Discharge: 2020-12-25 | Disposition: A | Discharge status: Home / Self Care | Payer: BC Managed Care – PPO | Source: Home / Self Care | Attending: Internal Medicine | Admitting: Internal Medicine

## 2020-12-25 ENCOUNTER — Encounter
Admission: EM | Disposition: A | Discharge status: Other Acute Inpatient Hospital | Payer: Self-pay | Source: Home / Self Care | Attending: Obstetrics and Gynecology

## 2020-12-25 DIAGNOSIS — I214 Non-ST elevation (NSTEMI) myocardial infarction: Secondary | ICD-10-CM | POA: Diagnosis not present

## 2020-12-25 HISTORY — PX: LEFT HEART CATH AND CORONARY ANGIOGRAPHY: CATH118249

## 2020-12-25 LAB — CBC
HCT: 44.9 % (ref 39.0–52.0)
Hemoglobin: 15.5 g/dL (ref 13.0–17.0)
MCH: 31.4 pg (ref 26.0–34.0)
MCHC: 34.5 g/dL (ref 30.0–36.0)
MCV: 91.1 fL (ref 80.0–100.0)
Platelets: 225 10*3/uL (ref 150–400)
RBC: 4.93 MIL/uL (ref 4.22–5.81)
RDW: 13.5 % (ref 11.5–15.5)
WBC: 6.3 10*3/uL (ref 4.0–10.5)
nRBC: 0 % (ref 0.0–0.2)

## 2020-12-25 LAB — BASIC METABOLIC PANEL
Anion gap: 7 (ref 5–15)
BUN: 16 mg/dL (ref 6–20)
CO2: 25 mmol/L (ref 22–32)
Calcium: 8.8 mg/dL — ABNORMAL LOW (ref 8.9–10.3)
Chloride: 107 mmol/L (ref 98–111)
Creatinine, Ser: 1 mg/dL (ref 0.61–1.24)
GFR, Estimated: >60 mL/min (ref >60)
Glucose, Bld: 81 mg/dL (ref 70–99)
Potassium: 4.3 mmol/L (ref 3.5–5.1)
Sodium: 139 mmol/L (ref 135–145)

## 2020-12-25 LAB — ECHOCARDIOGRAM COMPLETE
AR max vel: 2.42 cm2
AV Area VTI: 2.62 cm2
AV Area mean vel: 2.61 cm2
AV Mean grad: 3 mmHg
AV Peak grad: 6 mmHg
Ao pk vel: 1.22 m/s
Area-P 1/2: 3.81 cm2
Height: 66 in
MV VTI: 2.37 cm2
S' Lateral: 3.3 cm
Weight: 3629.65 oz

## 2020-12-25 LAB — HEPARIN LEVEL (UNFRACTIONATED): Heparin Unfractionated: 0.36 IU/mL (ref 0.30–0.70)

## 2020-12-25 SURGERY — LEFT HEART CATH AND CORONARY ANGIOGRAPHY
Anesthesia: Moderate Sedation

## 2020-12-25 MED ORDER — ACETAMINOPHEN 325 MG PO TABS: 650.0000 mg | ORAL_TABLET | ORAL | Status: DC | PRN

## 2020-12-25 MED ORDER — FENTANYL CITRATE (PF) 100 MCG/2ML IJ SOLN
INTRAMUSCULAR | Status: AC
  Filled 2020-12-25: qty 2

## 2020-12-25 MED ORDER — ONDANSETRON HCL 4 MG/2ML IJ SOLN: 4.0000 mg | Freq: Four times a day (QID) | INTRAMUSCULAR | Status: DC | PRN

## 2020-12-25 MED ORDER — SODIUM CHLORIDE 0.9 % WEIGHT BASED INFUSION: 3.0000 mL/kg/h | INTRAVENOUS | Status: DC

## 2020-12-25 MED ORDER — SODIUM CHLORIDE 0.9 % WEIGHT BASED INFUSION: 1.0000 mL/kg/h | INTRAVENOUS | Status: DC

## 2020-12-25 MED ORDER — SODIUM CHLORIDE 0.9% FLUSH: 3.0000 mL | INTRAVENOUS | Status: DC | PRN

## 2020-12-25 MED ORDER — IOHEXOL 300 MG/ML  SOLN
INTRAMUSCULAR | Status: DC | PRN
  Administered 2020-12-25: 122 mL

## 2020-12-25 MED ORDER — LIDOCAINE HCL (PF) 1 % IJ SOLN
INTRAMUSCULAR | Status: AC
  Filled 2020-12-25: qty 30

## 2020-12-25 MED ORDER — LIDOCAINE HCL (PF) 1 % IJ SOLN
INTRAMUSCULAR | Status: DC | PRN
  Administered 2020-12-25: 30 mL

## 2020-12-25 MED ORDER — SODIUM CHLORIDE 0.9% FLUSH: 3.0000 mL | Freq: Two times a day (BID) | INTRAVENOUS | Status: DC

## 2020-12-25 MED ORDER — MIDAZOLAM HCL 2 MG/2ML IJ SOLN
INTRAMUSCULAR | Status: AC
  Filled 2020-12-25: qty 2

## 2020-12-25 MED ORDER — SODIUM CHLORIDE 0.9 % IV SOLN: 250.0000 mL | INTRAVENOUS | Status: DC | PRN

## 2020-12-25 MED ORDER — SODIUM CHLORIDE 0.9 % IV BOLUS
250.0000 mL | Freq: Once | INTRAVENOUS | Status: AC
  Administered 2020-12-25: 250 mL via INTRAVENOUS

## 2020-12-25 MED ORDER — FENTANYL CITRATE (PF) 100 MCG/2ML IJ SOLN
INTRAMUSCULAR | Status: DC | PRN
  Administered 2020-12-25: 25 ug via INTRAVENOUS

## 2020-12-25 MED ORDER — ASPIRIN 81 MG PO CHEW: 81.0000 mg | CHEWABLE_TABLET | ORAL | Status: DC

## 2020-12-25 MED ORDER — HEPARIN (PORCINE) IN NACL 1000-0.9 UT/500ML-% IV SOLN
INTRAVENOUS | Status: AC
  Filled 2020-12-25: qty 1000

## 2020-12-25 MED ORDER — HEPARIN (PORCINE) IN NACL 1000-0.9 UT/500ML-% IV SOLN
INTRAVENOUS | Status: DC | PRN
  Administered 2020-12-25 (×2): 500 mL

## 2020-12-25 MED ORDER — MIDAZOLAM HCL 2 MG/2ML IJ SOLN
INTRAMUSCULAR | Status: DC | PRN
  Administered 2020-12-25: 1 mg via INTRAVENOUS

## 2020-12-25 MED ORDER — SODIUM CHLORIDE 0.9% FLUSH
3.0000 mL | Freq: Two times a day (BID) | INTRAVENOUS | Status: DC
  Administered 2020-12-25 (×2): 3 mL via INTRAVENOUS

## 2020-12-25 MED ORDER — HYDRALAZINE HCL 20 MG/ML IJ SOLN: 10.0000 mg | INTRAMUSCULAR | Status: AC | PRN

## 2020-12-25 MED ORDER — LABETALOL HCL 5 MG/ML IV SOLN: 10.0000 mg | INTRAVENOUS | Status: AC | PRN

## 2020-12-25 MED ORDER — PERFLUTREN LIPID MICROSPHERE
1.0000 mL | INTRAVENOUS | Status: AC | PRN
  Administered 2020-12-25: 3 mL via INTRAVENOUS
  Filled 2020-12-25: qty 10

## 2020-12-25 SURGICAL SUPPLY — 8 items
CATH INFINITI 5FR JL4 (CATHETERS) ×2 IMPLANT
CATH INFINITI JR4 5F (CATHETERS) ×2 IMPLANT
DEVICE CLOSURE MYNXGRIP 5F (Vascular Products) ×2 IMPLANT
NEEDLE PERC 18GX7CM (NEEDLE) ×2 IMPLANT
PACK CARDIAC CATH (CUSTOM PROCEDURE TRAY) ×2 IMPLANT
SET ATX SIMPLICITY (MISCELLANEOUS) ×2 IMPLANT
SHEATH AVANTI 5FR X 11CM (SHEATH) ×2 IMPLANT
WIRE GUIDERIGHT .035X150 (WIRE) ×2 IMPLANT

## 2020-12-25 NOTE — Consult Note (Signed)
ANTICOAGULATION CONSULT NOTE   Pharmacy Consult for heparin Indication: chest pain/ACS  No Known Allergies  Patient Measurements: Height: 5\' 6"  (167.6 cm) Weight: 102.9 kg (226 lb 13.7 oz) IBW/kg (Calculated) : 63.8 Heparin Dosing Weight: 86.3  Vital Signs: Temp: 98.5 F (36.9 C) (03/08 0349) Temp Source: Oral (03/08 0349) BP: 119/77 (03/08 0349) Pulse Rate: 67 (03/08 0349)  Labs: Recent Labs    12/23/20 1850 12/23/20 1850 12/23/20 2006 12/23/20 2100 12/23/20 2306 12/24/20 0217 12/24/20 0948 12/24/20 1542 12/25/20 0429  HGB 16.0  --   --   --   --  14.7  --   --  15.5  HCT 47.1  --   --   --   --  43.9  --   --  44.9  PLT 253  --   --   --   --  235  --   --  225  APTT  --   --  32  --   --   --   --   --   --   LABPROT  --   --  12.6  --   --  13.0  --   --   --   INR  --   --  1.0  --   --  1.0  --   --   --   HEPARINUNFRC  --    < >  --   --   --  0.13* 0.44 0.41 0.36  CREATININE 0.89  --   --   --   --  0.79  --   --  1.00  TROPONINIHS 267*  --   --  447* 1,480*  --  2,822*  --   --    < > = values in this interval not displayed.    Estimated Creatinine Clearance: 103.7 mL/min (by C-G formula based on SCr of 1 mg/dL).   Medical History: Past Medical History:  Diagnosis Date  . CHF (congestive heart failure) (HCC)   . Coronary artery disease   . High cholesterol   . Hypertension   . ST elevation (STEMI) myocardial infarction involving left anterior descending coronary artery [facility]) Jan 2016  . Tobacco use     Medications:  No PTA APT or AC  Assessment: 45 y.o. male with history of CHF, CAD, CABG who presents with complaints of chest pain. He reports he took nitroglycerin x2 at home with little improvement. EKG with nonspecific ST changes and  troponin of 267. Pharmacy has been consulted for heparin dosing.  Heparin Dosing Weight: 86.3  3/7 0217 HL 0.13  3/7 0948 HL 0.44  3/7 1542 HL 0.41 3/8 0429 HL 0.36  Goal of Therapy:  Heparin level 0.3-0.7  units/ml Monitor platelets by anticoagulation protocol: Yes   Plan:  Heparin level is therapeutic. Will continue current heparin infusion at 1400 units/hr. Recheck HL in with AM labs. CBC daily while on heparin.    5/8, PharmD, BCPS 12/25/2020,5:13 AM

## 2020-12-25 NOTE — Progress Notes (Signed)
Mobility Specialist - Progress Note   12/25/20 1500  Mobility  Activity Off unit  Mobility performed by Mobility specialist    Pt off-unit for coronary angiography procedure at this time. Will attempt session another date/time as pt is available.    Filiberto Pinks Mobility Specialist 12/25/20, 3:05 PM

## 2020-12-25 NOTE — Progress Notes (Signed)
Call received from [facility] for notification of available bed at [facility], Room [phone].  Pt made aware, he has notified wife of pending transfer.

## 2020-12-25 NOTE — Progress Notes (Signed)
*  PRELIMINARY RESULTS* Echocardiogram 2D Echocardiogram has been performed.  Gregg [name] 12/25/2020, 11:25 AM

## 2020-12-25 NOTE — Progress Notes (Signed)
Gregg [name], Gregg [name] Per  Outpatient Specialists: [facility]    Brief Narrative:  From admission hpi Gregg [name] is a 45 y.o. male with medical history significant of prior tobacco abuse HTN, HLD, DVT axillary vein, OSA ,ST elevation MI involving left anterior descending (LAD) coronary artery 12/11/2014 and subsequent multivessel CABG, followed by NSTEMI 06/12/2019 with PCI/Stents 2x DES placed to LCx and OM, stable exertional angina controlled by long acting nitrates and ranexa, CHF last EF 50%, followed by Dr Lars Pinks at [facility], who presents to ed with chest pain that started 5:30 pm day of presentation. Pain is described as substernal pressure that radiates to back and b/l arms. It is worse with exertion. It is associated with    Sob,mild presyncope,mild epigastric pain. It is was not relieved by SL nitro x2 at home so he presented to ED. He also noted over the last 4 days he has had cold symptoms with congestion and cough. He states his chest pain started after he took medicine for his congestion.  He does note over the last month he has had increasing episodes of angina but notes that they would be relieved by SL. He denies any n/v/d /fever or chills.   Assessment & Plan:   Active Problems:   NSTEMI (non-ST elevated myocardial infarction) (HCC)  # NSTEMI # CAD # HFpEF S/p stemi with multi-vessel cabg in 2015, nstemi with PCI and DES x2 in 2020, here with chest pain similar in quality to prior MIs and troponin elevation to 1480. Started on heparin, received asa, currently chest pain free, Gregg dyspnea, cxr clear. EF 50% on 2020 TTE. - continue heparin and aspirin and losartan and atorvastatin, hold home effient - cont home metop and imdur - also on repatha q 2 wks at home - cardiology consulted - maintain telemetry - npo - tte read pending - cardiology advising LHC. [name] initially elected  to transport to [facility] and was accepted for transport yesterday pending bed availability. Gregg bed currently available, so [name] has elected to proceed w/ cath here today, plan is for that to happen this PM.   # HTN Here bp wnl - cont home amlodipine, metoprolol, losartan, imdur  # OSA - cpap qhs  # History provoked DVT S/p treatment    DVT prophylaxis: therapeutic heparin Code Status: full Family Communication:  Wife updated @ bedside 3/8  Level of care: Progressive Cardiac Status is: Inpatient  Remains inpatient appropriate because:Inpatient level of care appropriate due to severity of illness   Dispo: The [name] is from: Home              Anticipated d/c is to: Home              [name] currently is not medically stable to d/c.   Difficult to place [name] Gregg    Consultants:  cardiology  Procedures: none  Antimicrobials:  none    Subjective: This morning chest pain resolved, Gregg dyspnea. Gregg cough. Has appetite, Gregg abd pain, Gregg n/v/d.   Objective: Vitals:   12/25/20 0349 12/25/20 0500 12/25/20 0802 12/25/20 1200  BP: 119/77  119/70 117/77  Pulse: 67  68 66  Resp: 17  16 16   Temp: 98.5 F (36.9 C)  97.9 F (36.6 C) 97.9 F (36.6 C)  TempSrc: Oral  Oral Oral  SpO2: 97%  98% 97%  Weight:  102.9 kg  Height:        Intake/Output Summary (Last 24 hours) at 12/25/2020 1221 Last data filed at 12/25/2020 0950 Gross per 24 hour  Intake 840 ml  Output -  Net 840 ml   Filed Weights   12/23/20 2152 12/24/20 0409 12/25/20 0500  Weight: 103.1 kg 103.2 kg 102.9 kg    Examination:  General exam: Appears calm and comfortable  Respiratory system: Clear to auscultation. Respiratory effort normal. Cardiovascular system: S1 & S2 heard, RRR. Gregg JVD, murmurs, rubs, gallops or clicks. Gregg pedal edema. Gastrointestinal system: Abdomen is nondistended, soft and nontender. Gregg organomegaly or masses felt. Normal bowel sounds heard. Central nervous system: Alert and  oriented. Gregg focal neurological deficits. Extremities: Symmetric 5 x 5 power. Skin: Gregg rashes, lesions or ulcers Psychiatry: Judgement and insight appear normal. Mood & affect appropriate.     Data Reviewed: I have personally reviewed following labs and imaging studies  CBC: Recent Labs  Lab 12/23/20 1850 12/24/20 0217 12/25/20 0429  WBC 4.9 5.0 6.3  HGB 16.0 14.7 15.5  HCT 47.1 43.9 44.9  MCV 91.6 92.4 91.1  PLT 253 235 225   Basic Metabolic Panel: Recent Labs  Lab 12/23/20 1850 12/24/20 0217 12/25/20 0429  NA 135 137 139  K 4.2 4.1 4.3  CL 103 108 107  CO2 23 24 25   GLUCOSE 151* 88 81  BUN 14 12 16   CREATININE 0.89 0.79 1.00  CALCIUM 8.7* 8.4* 8.8*   GFR: Estimated Creatinine Clearance: 103.7 mL/min (by C-G formula based on SCr of 1 mg/dL). Liver Function Tests: Gregg results for input(s): AST, ALT, ALKPHOS, BILITOT, PROT, ALBUMIN in the last 168 hours. Gregg results for input(s): LIPASE, AMYLASE in the last 168 hours. Gregg results for input(s): AMMONIA in the last 168 hours. Coagulation Profile: Recent Labs  Lab 12/23/20 2006 12/24/20 0217  INR 1.0 1.0   Cardiac Enzymes: Gregg results for input(s): CKTOTAL, CKMB, CKMBINDEX, TROPONINI in the last 168 hours. BNP (last 3 results) Gregg results for input(s): PROBNP in the last 8760 hours. HbA1C: Recent Labs    12/23/20 2100  HGBA1C 6.1*   CBG: Recent Labs  Lab 12/23/20 2222  GLUCAP 118*   Lipid Profile: Recent Labs    12/24/20 0217  CHOL 39  HDL 19*  LDLCALC 0  TRIG 99  CHOLHDL 2.1   Thyroid Function Tests: Recent Labs    12/23/20 2100  TSH 1.245   Anemia Panel: Gregg results for input(s): VITAMINB12, FOLATE, FERRITIN, TIBC, IRON, RETICCTPCT in the last 72 hours. Urine analysis:    Component Value Date/Time   COLORURINE YELLOW (A) 10/16/2015 1343   APPEARANCEUR CLEAR (A) 10/16/2015 1343   LABSPEC 1.042 (H) 10/16/2015 1343   PHURINE 6.0 10/16/2015 1343   GLUCOSEU NEGATIVE 10/16/2015 1343   HGBUR  NEGATIVE 10/16/2015 1343   BILIRUBINUR NEGATIVE 10/16/2015 1343   KETONESUR NEGATIVE 10/16/2015 1343   PROTEINUR NEGATIVE 10/16/2015 1343   NITRITE NEGATIVE 10/16/2015 1343   LEUKOCYTESUR NEGATIVE 10/16/2015 1343   Sepsis Labs: @LABRCNTIP (procalcitonin:4,lacticidven:4)  ) Recent Results (from the past 240 hour(s))  Resp Panel by RT-PCR (Flu A&B, Covid) Nasopharyngeal Swab     Status: None   Collection Time: 12/23/20  8:06 PM   Specimen: Nasopharyngeal Swab; Nasopharyngeal(NP) swabs in vial transport medium  Result Value Ref Range Status   SARS Coronavirus 2 by RT PCR NEGATIVE NEGATIVE Final    Comment: (NOTE) SARS-CoV-2 target nucleic acids are NOT DETECTED.  The SARS-CoV-2 RNA is generally detectable in upper  respiratory specimens during the acute phase of infection. The lowest concentration of SARS-CoV-2 viral copies this assay can detect is 138 copies/mL. A negative result does not preclude SARS-Cov-2 infection and should not be used as the sole basis for treatment or other [name] management decisions. A negative result may occur with  improper specimen collection/handling, submission of specimen other than nasopharyngeal swab, presence of viral mutation(s) within the areas targeted by this assay, and inadequate number of viral copies(<138 copies/mL). A negative result must be combined with clinical observations, [name] history, and epidemiological information. The expected result is Negative.  Fact Sheet for Patients:  BloggerCourse.com  Fact Sheet for Healthcare Providers:  SeriousBroker.it  This test is Gregg t yet approved or cleared by the [place] FDA and  has been authorized for detection and/or diagnosis of SARS-CoV-2 by FDA under an Emergency Use Authorization (EUA). This EUA will remain  in effect (meaning this test can be used) for the duration of the COVID-19 declaration under Section 564(b)(1) of the Act,  21 U.S.C.section 360bbb-3(b)(1), unless the authorization is terminated  or revoked sooner.       Influenza A by PCR NEGATIVE NEGATIVE Final   Influenza B by PCR NEGATIVE NEGATIVE Final    Comment: (NOTE) The Xpert Xpress SARS-CoV-2/FLU/RSV plus assay is intended as an aid in the diagnosis of influenza from Nasopharyngeal swab specimens and should not be used as a sole basis for treatment. Nasal washings and aspirates are unacceptable for Xpert Xpress SARS-CoV-2/FLU/RSV testing.  Fact Sheet for Patients: BloggerCourse.com  Fact Sheet for Healthcare Providers: SeriousBroker.it  This test is not yet approved or cleared by the [place] FDA and has been authorized for detection and/or diagnosis of SARS-CoV-2 by FDA under an Emergency Use Authorization (EUA). This EUA will remain in effect (meaning this test can be used) for the duration of the COVID-19 declaration under Section 564(b)(1) of the Act, 21 U.S.C. section 360bbb-3(b)(1), unless the authorization is terminated or revoked.  Performed at [facility], [address] [place], [place] [address]          Radiology Studies: DG Chest 2 View  Result Date: 12/23/2020 CLINICAL DATA:  Chest pain. EXAM: CHEST - 2 VIEW COMPARISON:  [place] [address] FINDINGS: Post median sternotomy. Upper normal heart size, stable. Coronary stent is visualized. Normal mediastinal contours. Gregg pulmonary edema, focal airspace disease, pleural effusion or pneumothorax. Gregg acute osseous abnormalities are seen. IMPRESSION: Gregg acute chest findings. Electronically Signed   By: Narda Rutherford M.D.   On: 12/23/2020 19:48        Scheduled Meds: . amLODipine  5 mg Oral Daily  . aspirin EC  81 mg Oral Daily  . atorvastatin  80 mg Oral Daily  . isosorbide mononitrate  30 mg Oral Daily  . losartan  100 mg Oral Daily  . metoprolol succinate  25 mg Oral Daily  . pantoprazole  20  mg Oral Daily  . sodium chloride flush  3 mL Intravenous Q12H   Continuous Infusions: . heparin 1,400 Units/hr (12/25/20 0856)     LOS: 2 days    Time spent: 40 min    Silvano Bilis, MD [facility]   If 7PM-7AM, please contact night-coverage [email or web address] Password [facility] 12/25/2020, 12:21 PM

## 2020-12-25 NOTE — Plan of Care (Addendum)
Heparin resumed this shift as ordered.  Right groin with bleed or hematoma.  Darl Pikes in pharmacy made aware.

## 2020-12-26 ENCOUNTER — Encounter: Payer: Self-pay | Admitting: Internal Medicine

## 2020-12-26 DIAGNOSIS — I214 Non-ST elevation (NSTEMI) myocardial infarction: Secondary | ICD-10-CM | POA: Diagnosis not present

## 2020-12-26 NOTE — Progress Notes (Signed)
Call received from [facility] to give their ETA.  Report given at this time.  Call then placed to [facility] Claudius Sis, RN for handoff report.  Pt aware.  No complaints of pain.  Right femoral groin site without bleed or hematoma.

## 2020-12-26 NOTE — Progress Notes (Signed)
Patient released to care of [facility] transport team.  No complaints of pain or shortness of breath.  Right groin without bleed or hematoma. Heparin infusing at 14 ml/hr.

## 2020-12-26 NOTE — Discharge Summary (Signed)
Physician Discharge Summary  ENGLISH CRAIGHEAD XFG:[id number] DOB: October 27, 1975 DOA: 12/23/2020  PCP: Patient, No Pcp Per  Admit date: 12/23/2020 Discharge date: 12/26/2020  Admitted From: home Disposition:  Transfer to [facility]   Discharge Condition:stable CODE STATUS:full   Brief/Interim Summary: Dario Ave a 45 y.o.malewith medical history significant ofprior tobacco abuse HTN, HLD, DVT axillary vein, OSA ,ST elevationMIinvolving left anterior descending (LAD) coronary artery 12/11/2014 and subsequent multivessel CABG, followed byNSTEMI 06/12/2019 with PCI/Stents 2x DES placed to LCx and OM, stable exertional angina controlled by long acting nitrates and ranexa, CHF last EF 50%, followed by Dr Lars Pinks at [facility], who presents to ed with chest pain that started 5:30 pm day of presentation.  Troponins above thousand Patient was diagnosed with NSTEMI and started on a heparin infusion.  He initially requested transfer to [facility] however due to bed unavailability he agreed to undergo left heart cath at this hospital which was done on 12/25/2020. Cath report findings suggested that CABG/complex intervention would be needed due to left main into ostial circumflex in-stent restenosis.  Cardiologist, Dr. Juliann Pares, spoke with CCU fellow Dr. Lendell Caprice who accepted patient in transfer.  Patient is stable for transfer to [facility].  Conclusion of cath(please see enclosed CD):  Multivessel severe coronary artery disease Native Left main occluded mid to distal in-stent restenosis Ostial circumflex 100% occluded in-stent restenosis Ostial LAD 100% occluded in-stent restenosis Medium ramus with a mid stent 100% occluded RCA medium in size diffuse 75% proximal to mid 100% distal Grafts LIMA to mid LAD widely patent SVG to circumflex 100% occluded CTO SVG to distal RCA widely patent sluggish flow  poor distal runoff Minx deployed Intervention was deferred     Discharge Diagnoses:  Active Problems:    NSTEMI (non-ST elevated myocardial infarction) [facility])    Discharge Instructions  Discharge Instructions    AMB Referral to Cardiac Rehabilitation - Phase II   Complete by: As directed    Diagnosis: NSTEMI   After initial evaluation and assessments completed: Virtual Based Care may be provided alone or in conjunction with Phase 2 Cardiac Rehab based on patient barriers.: Yes   Diet - low sodium heart healthy   Complete by: As directed    Increase activity slowly   Complete by: As directed      Allergies as of 12/26/2020   No Known Allergies     Medication List    TAKE these medications   amLODipine 5 MG tablet Commonly known as: NORVASC Take 5 mg by mouth daily.   aspirin EC 81 MG tablet Take 81 mg by mouth daily.   atorvastatin 80 MG tablet Commonly known as: LIPITOR Take 1 tablet by mouth daily.   isosorbide mononitrate 30 MG 24 hr tablet Commonly known as: IMDUR Take 1 tablet by mouth daily.   losartan 100 MG tablet Commonly known as: COZAAR Take 100 mg by mouth daily.   metoprolol succinate 25 MG 24 hr tablet Commonly known as: TOPROL-XL Take 1 tablet by mouth daily.   nitroGLYCERIN 0.4 MG SL tablet Commonly known as: NITROSTAT Place 1 tablet under the tongue daily as needed.   prasugrel 10 MG Tabs tablet Commonly known as: EFFIENT Take 10 mg by mouth daily.   Repatha 140 MG/ML Sosy Generic drug: Evolocumab Inject into the skin.       No Known Allergies  Consultations: Dr. Juliann Pares  Procedures/Studies: DG Chest 2 View  Result Date: 12/23/2020 CLINICAL DATA:  Chest pain. EXAM: CHEST - 2 VIEW COMPARISON:  [place] [address] FINDINGS: Post median sternotomy. Upper normal heart size, stable. Coronary stent is visualized. Normal mediastinal contours. No pulmonary edema, focal airspace disease, pleural effusion or pneumothorax. No acute osseous abnormalities are seen. IMPRESSION: No acute chest findings. Electronically Signed   By: Narda Rutherford  M.D.   On: 12/23/2020 19:48   CARDIAC CATHETERIZATION  Result Date: 12/25/2020  Ost RCA to Mid RCA lesion is 75% stenosed.  Mid RCA to Dist RCA lesion is 100% stenosed.  Ost Cx to Prox Cx lesion is 100% stenosed.  Ost LM to Dist LM lesion is 100% stenosed.  Ost LAD to Mid LAD lesion is 100% stenosed.  Ramus lesion is 100% stenosed.  Dist LAD lesion is 75% stenosed.  Origin to Prox Graft lesion is 100% stenosed.  In-stent restenosis from left main into proximal circumflex 100% occluded  Proximal ostial stent in the LAD with 100% occluded  Mid stent to ramus was 100% occluded  SVG to circumflex 100% occluded at the origin CTO  SVG to distal RCA widely patent with sluggish flow poor distal runoff  LIMA to mid LAD widely patent  Left ventricular function was moderately depressed between 35 and 40% globally with severe anterior apical hypokinesis  There is evidence of left ventricular enlargement as well  Conclusion Multivessel severe coronary artery disease Native Left main occluded mid to distal in-stent restenosis Ostial circumflex 100% occluded in-stent restenosis Ostial LAD 100% occluded in-stent restenosis Medium ramus with a mid stent 100% occluded RCA medium in size diffuse 75% proximal to mid 100% distal Grafts LIMA to mid LAD widely patent SVG to circumflex 100% occluded CTO SVG to distal RCA widely patent sluggish flow  poor distal runoff Minx deployed Intervention was deferred Arrange transfer to [facility] for evaluation of complex intervention of the left main into ostial circumflex in-stent restenosis  ECHOCARDIOGRAM COMPLETE  Result Date: 12/25/2020    ECHOCARDIOGRAM REPORT   Patient Name:   LELAN CUSH Date of Exam: 12/25/2020 Medical Rec #:  [id number]        Height:       66.0 in Accession #:    [id number]       Weight:       226.9 lb Date of Birth:  1976/08/13       BSA:          2.110 m Patient Age:    45 years         BP:           119/70 mmHg Patient Gender: M                HR:            68 bpm. Exam Location:  [facility] Procedure: 2D Echo, Color Doppler, Cardiac Doppler and Intracardiac            Opacification Agent Indications:     I21.4 NSTEMI  History:         Patient has no prior history of Echocardiogram examinations.                  CHF, CAD, Prior CABG; Risk Factors:Former Smoker, Hypertension                  and HCL.  Sonographer:     Humphrey Rolls RDCS (AE) Referring Phys:  [id number] [facility] A THOMAS Diagnosing Phys: Alwyn Pea MD  Sonographer Comments: Suboptimal apical window and suboptimal subcostal window. Image acquisition challenging due to  patient body habitus. IMPRESSIONS  1. Anterior apical hypo.  2. Left ventricular ejection fraction, by estimation, is 40 to 45%. The left ventricle has mildly decreased function. The left ventricle demonstrates global hypokinesis. The left ventricular internal cavity size was mildly dilated. Left ventricular diastolic parameters were normal.  3. Right ventricular systolic function is normal. The right ventricular size is normal.  4. The mitral valve is normal in structure. No evidence of mitral valve regurgitation.  5. The aortic valve is normal in structure. Aortic valve regurgitation is not visualized. FINDINGS  Left Ventricle: Left ventricular ejection fraction, by estimation, is 40 to 45%. The left ventricle has mildly decreased function. The left ventricle demonstrates global hypokinesis. Definity contrast agent was given IV to delineate the left ventricular  endocardial borders. The left ventricular internal cavity size was mildly dilated. There is no left ventricular hypertrophy. Left ventricular diastolic parameters were normal. Right Ventricle: The right ventricular size is normal. No increase in right ventricular wall thickness. Right ventricular systolic function is normal. Left Atrium: Left atrial size was normal in size. Right Atrium: Right atrial size was normal in size. Pericardium: There is no evidence of pericardial  effusion. Mitral Valve: The mitral valve is normal in structure. No evidence of mitral valve regurgitation. MV peak gradient, 4.8 mmHg. The mean mitral valve gradient is 2.0 mmHg. Tricuspid Valve: The tricuspid valve is normal in structure. Tricuspid valve regurgitation is trivial. Aortic Valve: The aortic valve is normal in structure. Aortic valve regurgitation is not visualized. Aortic valve mean gradient measures 3.0 mmHg. Aortic valve peak gradient measures 6.0 mmHg. Aortic valve area, by VTI measures 2.62 cm. Pulmonic Valve: The pulmonic valve was normal in structure. Pulmonic valve regurgitation is not visualized. Aorta: The ascending aorta was not well visualized. IAS/Shunts: No atrial level shunt detected by color flow Doppler. Additional Comments: Anterior apical hypo.  LEFT VENTRICLE PLAX 2D LVIDd:         5.00 cm  Diastology LVIDs:         3.30 cm  LV e' medial:    7.72 cm/s LV PW:         1.20 cm  LV E/e' medial:  12.9 LV IVS:        0.80 cm  LV e' lateral:   7.18 cm/s LVOT diam:     2.00 cm  LV E/e' lateral: 13.9 LV SV:         59 LV SV Index:   28 LVOT Area:     3.14 cm  LEFT ATRIUM             Index LA diam:        4.00 cm 1.90 cm/m LA Vol (A2C):   48.0 ml 22.75 ml/m LA Vol (A4C):   29.1 ml 13.79 ml/m LA Biplane Vol: 37.4 ml 17.73 ml/m  AORTIC VALVE                   PULMONIC VALVE AV Area (Vmax):    2.42 cm    PV Vmax:       1.28 m/s AV Area (Vmean):   2.61 cm    PV Vmean:      77.600 cm/s AV Area (VTI):     2.62 cm    PV VTI:        0.251 m AV Vmax:           122.00 cm/s PV Peak grad:  6.6 mmHg AV Vmean:  79.500 cm/s PV Mean grad:  3.0 mmHg AV VTI:            0.227 m AV Peak Grad:      6.0 mmHg AV Mean Grad:      3.0 mmHg LVOT Vmax:         94.00 cm/s LVOT Vmean:        66.000 cm/s LVOT VTI:          0.189 m LVOT/AV VTI ratio: 0.83  AORTA Ao Root diam: 2.90 cm MITRAL VALVE MV Area (PHT): 3.81 cm    SHUNTS MV Area VTI:   2.37 cm    Systemic VTI:  0.19 m MV Peak grad:  4.8 mmHg     Systemic Diam: 2.00 cm MV Mean grad:  2.0 mmHg MV Vmax:       1.09 m/s MV Vmean:      55.1 cm/s MV Decel Time: 199 msec MV E velocity: 99.80 cm/s MV A velocity: 51.00 cm/s MV E/A ratio:  1.96 Dwayne D Callwood MD Electronically signed by Alwyn Peawayne D Callwood MD Signature Date/Time: 12/25/2020/6:03:46 PM    Final        Subjective:   Discharge Exam: Vitals:   12/25/20 2000 12/26/20 0023  BP: 109/61 121/81  Pulse: 68 70  Resp: 17 17  Temp: 98 F (36.7 C) 98.7 F (37.1 C)  SpO2: 93% 97%   Vitals:   12/25/20 1645 12/25/20 1702 12/25/20 2000 12/26/20 0023  BP: 103/65 108/72 109/61 121/81  Pulse: (!) 58 66 68 70  Resp: 15  17 17   Temp:  (!) 97.5 F (36.4 C) 98 F (36.7 C) 98.7 F (37.1 C)  TempSrc:  Oral Oral Oral  SpO2: 92% 96% 93% 97%  Weight:      Height:        General: Pt is alert, awake, not in acute distress Cardiovascular: RRR, S1/S2 +, no rubs, no gallops Respiratory: CTA bilaterally, no wheezing, no rhonchi Abdominal: Soft, NT, ND, bowel sounds + Extremities: no edema, no cyanosis    The results of significant diagnostics from this hospitalization (including imaging, microbiology, ancillary and laboratory) are listed below for reference.     Microbiology: Recent Results (from the past 240 hour(s))  Resp Panel by RT-PCR (Flu A&B, Covid) Nasopharyngeal Swab     Status: None   Collection Time: 12/23/20  8:06 PM   Specimen: Nasopharyngeal Swab; Nasopharyngeal(NP) swabs in vial transport medium  Result Value Ref Range Status   SARS Coronavirus 2 by RT PCR NEGATIVE NEGATIVE Final    Comment: (NOTE) SARS-CoV-2 target nucleic acids are NOT DETECTED.  The SARS-CoV-2 RNA is generally detectable in upper respiratory specimens during the acute phase of infection. The lowest concentration of SARS-CoV-2 viral copies this assay can detect is 138 copies/mL. A negative result does not preclude SARS-Cov-2 infection and should not be used as the sole basis for treatment  or other patient management decisions. A negative result may occur with  improper specimen collection/handling, submission of specimen other than nasopharyngeal swab, presence of viral mutation(s) within the areas targeted by this assay, and inadequate number of viral copies(<138 copies/mL). A negative result must be combined with clinical observations, patient history, and epidemiological information. The expected result is Negative.  Fact Sheet for Patients:  BloggerCourse.comhttps://www.fda.gov/media/152166/download  Fact Sheet for Healthcare Providers:  SeriousBroker.ithttps://www.fda.gov/media/152162/download  This test is no t yet approved or cleared by the [place] FDA and  has been authorized for detection and/or diagnosis of SARS-CoV-2 by  FDA under an Emergency Use Authorization (EUA). This EUA will remain  in effect (meaning this test can be used) for the duration of the COVID-19 declaration under Section 564(b)(1) of the Act, 21 U.S.C.section 360bbb-3(b)(1), unless the authorization is terminated  or revoked sooner.       Influenza A by PCR NEGATIVE NEGATIVE Final   Influenza B by PCR NEGATIVE NEGATIVE Final    Comment: (NOTE) The Xpert Xpress SARS-CoV-2/FLU/RSV plus assay is intended as an aid in the diagnosis of influenza from Nasopharyngeal swab specimens and should not be used as a sole basis for treatment. Nasal washings and aspirates are unacceptable for Xpert Xpress SARS-CoV-2/FLU/RSV testing.  Fact Sheet for Patients: BloggerCourse.com  Fact Sheet for Healthcare Providers: SeriousBroker.it  This test is not yet approved or cleared by the [place] FDA and has been authorized for detection and/or diagnosis of SARS-CoV-2 by FDA under an Emergency Use Authorization (EUA). This EUA will remain in effect (meaning this test can be used) for the duration of the COVID-19 declaration under Section 564(b)(1) of the Act, 21 U.S.C. section  360bbb-3(b)(1), unless the authorization is terminated or revoked.  Performed at [facility], [address] [place], [place] [address]      Labs: BNP (last 3 results) No results for input(s): BNP in the last 8760 hours. Basic Metabolic Panel: Recent Labs  Lab 12/23/20 1850 12/24/20 0217 12/25/20 0429  NA 135 137 139  K 4.2 4.1 4.3  CL 103 108 107  CO2 GLUCOSE 151* 88 81  BUN CREATININE 0.89 0.79 1.00  CALCIUM 8.7* 8.4* 8.8*   Liver Function Tests: No results for input(s): AST, ALT, ALKPHOS, BILITOT, PROT, ALBUMIN in the last 168 hours. No results for input(s): LIPASE, AMYLASE in the last 168 hours. No results for input(s): AMMONIA in the last 168 hours. CBC: Recent Labs  Lab 12/23/20 1850 12/24/20 0217 12/25/20 0429  WBC 4.9 5.0 6.3  HGB 16.0 14.7 15.5  HCT 47.1 43.9 44.9  MCV 91.6 92.4 91.1  PLT 253 235 225   Cardiac Enzymes: No results for input(s): CKTOTAL, CKMB, CKMBINDEX, TROPONINI in the last 168 hours. BNP: Invalid input(s): POCBNP CBG: Recent Labs  Lab 12/23/20 2222  GLUCAP 118*   D-Dimer No results for input(s): DDIMER in the last 72 hours. Hgb A1c Recent Labs    12/23/20 2100  HGBA1C 6.1*   Lipid Profile Recent Labs    12/24/20 0217  CHOL 39  HDL 19*  LDLCALC 0  TRIG 99  CHOLHDL 2.1   Thyroid function studies Recent Labs    12/23/20 2100  TSH 1.245   Anemia work up No results for input(s): VITAMINB12, FOLATE, FERRITIN, TIBC, IRON, RETICCTPCT in the last 72 hours. Urinalysis    Component Value Date/Time   COLORURINE YELLOW (A) 10/16/2015 1343   APPEARANCEUR CLEAR (A) 10/16/2015 1343   LABSPEC 1.042 (H) 10/16/2015 1343   PHURINE 6.0 10/16/2015 1343   GLUCOSEU NEGATIVE 10/16/2015 1343   HGBUR NEGATIVE 10/16/2015 1343   BILIRUBINUR NEGATIVE 10/16/2015 1343   KETONESUR NEGATIVE 10/16/2015 1343   PROTEINUR NEGATIVE 10/16/2015 1343   NITRITE NEGATIVE 10/16/2015 1343   LEUKOCYTESUR NEGATIVE  10/16/2015 1343   Sepsis Labs Invalid input(s): PROCALCITONIN,  WBC,  LACTICIDVEN Microbiology Recent Results (from the past 240 hour(s))  Resp Panel by RT-PCR (Flu A&B, Covid) Nasopharyngeal Swab     Status: None   Collection Time: 12/23/20  8:06 PM   Specimen: Nasopharyngeal  Swab; Nasopharyngeal(NP) swabs in vial transport medium  Result Value Ref Range Status   SARS Coronavirus 2 by RT PCR NEGATIVE NEGATIVE Final    Comment: (NOTE) SARS-CoV-2 target nucleic acids are NOT DETECTED.  The SARS-CoV-2 RNA is generally detectable in upper respiratory specimens during the acute phase of infection. The lowest concentration of SARS-CoV-2 viral copies this assay can detect is 138 copies/mL. A negative result does not preclude SARS-Cov-2 infection and should not be used as the sole basis for treatment or other patient management decisions. A negative result may occur with  improper specimen collection/handling, submission of specimen other than nasopharyngeal swab, presence of viral mutation(s) within the areas targeted by this assay, and inadequate number of viral copies(<138 copies/mL). A negative result must be combined with clinical observations, patient history, and epidemiological information. The expected result is Negative.  Fact Sheet for Patients:  BloggerCourse.com  Fact Sheet for Healthcare Providers:  SeriousBroker.it  This test is no t yet approved or cleared by the [place] FDA and  has been authorized for detection and/or diagnosis of SARS-CoV-2 by FDA under an Emergency Use Authorization (EUA). This EUA will remain  in effect (meaning this test can be used) for the duration of the COVID-19 declaration under Section 564(b)(1) of the Act, 21 U.S.C.section 360bbb-3(b)(1), unless the authorization is terminated  or revoked sooner.       Influenza A by PCR NEGATIVE NEGATIVE Final   Influenza B by PCR NEGATIVE  NEGATIVE Final    Comment: (NOTE) The Xpert Xpress SARS-CoV-2/FLU/RSV plus assay is intended as an aid in the diagnosis of influenza from Nasopharyngeal swab specimens and should not be used as a sole basis for treatment. Nasal washings and aspirates are unacceptable for Xpert Xpress SARS-CoV-2/FLU/RSV testing.  Fact Sheet for Patients: BloggerCourse.com  Fact Sheet for Healthcare Providers: SeriousBroker.it  This test is not yet approved or cleared by the [place] FDA and has been authorized for detection and/or diagnosis of SARS-CoV-2 by FDA under an Emergency Use Authorization (EUA). This EUA will remain in effect (meaning this test can be used) for the duration of the COVID-19 declaration under Section 564(b)(1) of the Act, 21 U.S.C. section 360bbb-3(b)(1), unless the authorization is terminated or revoked.  Performed at [facility], [address], [place], [place] [address]      Time coordinating discharge: Over 30 minutes  SIGNED:   Andris Baumann, MD  [facility] 12/26/2020, 1:08 AM Pager

## 2022-08-07 ENCOUNTER — Ambulatory Visit
Admission: EM | Admit: 2022-08-07 | Discharge: 2022-08-07 | Disposition: A | Discharge status: Home / Self Care | Payer: BC Managed Care – PPO

## 2022-08-07 DIAGNOSIS — L230 Allergic contact dermatitis due to metals: Secondary | ICD-10-CM

## 2022-08-07 DIAGNOSIS — R252 Cramp and spasm: Secondary | ICD-10-CM | POA: Diagnosis not present

## 2022-08-07 MED ORDER — TRIAMCINOLONE ACETONIDE 0.1 % EX CREA: 1.0000 | TOPICAL_CREAM | Freq: Two times a day (BID) | CUTANEOUS | 0 refills | Status: AC

## 2022-08-07 NOTE — ED Provider Notes (Signed)
[facility]    CSN: [id number] Arrival date & time: 08/07/22  1354      History   Chief Complaint Chief Complaint  Patient presents with   Rash   Body spasms     HPI Gregg [name] is a 46 y.o. male.    Rash   Patient presents to urgent care with 2 complaints:  -He reports "muscle spasms" and cramping that occur recurrently at various places in his body.  He refers to an episode of severe cramping recently in his foot.  Cramps can occur in his lower legs his back his upper legs.  Patient reports being treated at cardiology and seen twice a year.  He is s/p CABG.  Uses furosemide 20 mg as diuretic.  Also taking valsartan versus losartan versus lisinopril based on MAR.  Past Medical History:  Diagnosis Date   CHF (congestive heart failure) (HCC)    Coronary artery disease    High cholesterol    Hypertension    ST elevation (STEMI) myocardial infarction involving left anterior descending coronary artery [facility]) Jan 2016   Tobacco use     Patient Active Problem List   Diagnosis Date Noted   Hypercholesteremia 06/14/2019   Ischemic cardiomyopathy 06/12/2019   Obstructive sleep apnea syndrome 12/10/2017   DVT of axillary vein, acute left ([place]) 10/17/2015   NSTEMI (non-ST elevated myocardial infarction) ([place]) 10/07/2015   CHF (congestive heart failure), NYHA class I, chronic, systolic ([place]) 38/75/6433   Coronary artery disease involving native coronary artery of native heart without angina pectoris 10/31/2014   Essential hypertension 08/12/2014   Former tobacco use 08/12/2014   History of hyperlipidemia 08/12/2014    Past Surgical History:  Procedure Laterality Date   APPENDECTOMY     cardiac bi pass  10/11/2015   CORONARY ANGIOPLASTY WITH STENT PLACEMENT     CORONARY ARTERY BYPASS GRAFT     LEFT HEART CATH AND CORONARY ANGIOGRAPHY N/A 12/25/2020   Procedure: LEFT HEART CATH AND CORONARY ANGIOGRAPHY and possible PCI and stent;  Surgeon: Yolonda Kida, MD;  Location: [facility];  Service: Cardiovascular;  Laterality: N/A;       Home Medications    Prior to Admission medications   Medication Sig Start Date End Date Taking? Authorizing Provider  nitroGLYCERIN (NITROSTAT) 0.4 MG SL tablet Place under the tongue. 03/19/22  Yes [provider]  amLODipine (NORVASC) 2.5 MG tablet Take 2.5 mg by mouth daily. 07/20/22   [provider]  amLODipine (NORVASC) 5 MG tablet Take 5 mg by mouth daily. 11/22/20   [provider]  aspirin EC 81 MG tablet Take 81 mg by mouth daily.    [provider]  aspirin EC 81 MG tablet Take by mouth.    [provider]  atorvastatin (LIPITOR) 40 MG tablet Take by mouth.    [provider]  atorvastatin (LIPITOR) 80 MG tablet Take 1 tablet by mouth daily. 10/15/15   [provider]  Evolocumab (REPATHA) 140 MG/ML SOSY Inject into the skin. 06/13/19   [provider]  furosemide (LASIX) 20 MG tablet Take 20 mg by mouth daily as needed. 07/21/22   [provider]  isosorbide mononitrate (IMDUR) 30 MG 24 hr tablet Take 1 tablet by mouth daily. 09/04/20   [provider]  lisinopril (ZESTRIL) 20 MG tablet Take by mouth.    [provider]  losartan (COZAAR) 100 MG tablet Take 100 mg by mouth daily. 11/21/20  [provider]  metoprolol succinate (TOPROL-XL) 25 MG 24 hr tablet Take 1 tablet by mouth daily.  10/15/15   [provider]  nitroGLYCERIN (NITROSTAT) 0.4 MG SL tablet Place 1 tablet under the tongue daily as needed. 09/16/20   [provider]  omeprazole (PRILOSEC) 20 MG capsule Take 20 mg by mouth daily. 07/12/22   [provider]  prasugrel (EFFIENT) 10 MG TABS tablet Take 10 mg by mouth daily. 12/12/20   [provider]  valsartan (DIOVAN) 160 MG tablet Take 160 mg by mouth daily. 07/16/22   [provider]    Family History History reviewed. No  pertinent family history.  Social History Social History   Tobacco Use   Smoking status: Former    Packs/day: 1.50    Years: 20.00    Total pack years: 30.00    Types: Cigarettes    Quit date: 10/05/2015    Years since quitting: 6.8   Smokeless tobacco: Never  Substance Use Topics   Alcohol use: Yes    Alcohol/week: 0.0 standard drinks of alcohol    Comment: occasional   Drug use: Never     Allergies   Lisinopril   Review of Systems Review of Systems  Skin:  Positive for rash.     Physical Exam Triage Vital Signs ED Triage Vitals  Enc Vitals Group     BP 08/07/22 1401 129/82     Pulse Rate 08/07/22 1401 (!) 108     Resp 08/07/22 1401 18     Temp 08/07/22 1401 98.5 F (36.9 C)     Temp Source 08/07/22 1401 Oral     SpO2 08/07/22 1401 94 %     Weight --      Height --      Head Circumference --      Peak Flow --      Pain Score 08/07/22 1405 0     Pain Loc --      Pain Edu? --      Excl. in [place]? --    No data found.  Updated Vital Signs BP 129/82 (BP Location: Left Arm)   Pulse (!) 108   Temp 98.5 F (36.9 C) (Oral)   Resp 18   SpO2 94%   Visual Acuity Right Eye Distance:   Left Eye Distance:   Bilateral Distance:    Right Eye Near:   Left Eye Near:    Bilateral Near:     Physical Exam Vitals reviewed.  Constitutional:      Appearance: Normal appearance.  Skin:    General: Skin is warm.     Findings: Rash present.       Neurological:     General: No focal deficit present.     Mental Status: He is alert and oriented to person, place, and time.  Psychiatric:        Mood and Affect: Mood normal.        Behavior: Behavior normal.      UC Treatments / Results  Labs (all labs ordered are listed, but only abnormal results are displayed) Labs Reviewed - No data to display  EKG   Radiology No results found.  Procedures Procedures (including critical care time)  Medications Ordered in UC Medications - No data to  display  Initial Impression / Assessment and Plan / UC Course  I have reviewed the triage vital signs and the nursing notes.  Pertinent labs & imaging results that were available during my  care of the patient were reviewed by me and considered in my medical decision making (see chart for details).   Rash appears atopic dermatitis, possibly as a result of contact with nickel plated belt buckle.  Recommended to patient that he change his belt to 1 that is nonallergic.  Suspect his muscle cramping is related to electrolyte imbalance, possibly secondary to furosemide use.  Discussed follow-up with the patient is not possible in urgent care.  The patient will follow-up with his cardiologist if there are any abnormal results.   Final Clinical Impressions(s) / UC Diagnoses   Final diagnoses:  None   Discharge Instructions   None    ED Prescriptions   None    PDMP not reviewed this encounter.   Rose Phi, [place] 08/07/22 1435

## 2022-08-07 NOTE — Discharge Instructions (Addendum)
I would recommend you replace your belt buckle with a nonnickel plated material. I have prescribed a stronger topical corticosteroid to use.  We have ordered blood work to check your electrolytes. Please follow-up with your cardiologist for any abnormal levels.

## 2022-08-07 NOTE — ED Triage Notes (Signed)
Pt. Presents to UC w/ c/o body spasms/ cramps that occur all over his body for the past 2 months. Pt. Also endorses a bumpy rash on his lower stomach that has been present for the past year. Pt. Has been treating the site with hydrocortisone cream.

## 2022-08-08 LAB — BASIC METABOLIC PANEL
BUN/Creatinine Ratio: 16 (ref 9–20)
BUN: 13 mg/dL (ref 6–24)
CO2: 24 mmol/L (ref 20–29)
Calcium: 9.1 mg/dL (ref 8.7–10.2)
Chloride: 102 mmol/L (ref 96–106)
Creatinine, Ser: 0.81 mg/dL (ref 0.76–1.27)
Glucose: 78 mg/dL (ref 70–99)
Potassium: 4.3 mmol/L (ref 3.5–5.2)
Sodium: 137 mmol/L (ref 134–144)
eGFR: 109 mL/min/{1.73_m2} (ref >59)

## 2022-08-08 LAB — MAGNESIUM: Magnesium: 2 mg/dL (ref 1.6–2.3)

## 2024-08-03 ENCOUNTER — Ambulatory Visit

## 2024-08-03 DIAGNOSIS — L732 Hidradenitis suppurativa: Secondary | ICD-10-CM

## 2024-08-03 DIAGNOSIS — D2371 Other benign neoplasm of skin of right lower limb, including hip: Secondary | ICD-10-CM

## 2024-08-03 DIAGNOSIS — B079 Viral wart, unspecified: Secondary | ICD-10-CM

## 2024-08-03 DIAGNOSIS — Z7189 Other specified counseling: Secondary | ICD-10-CM | POA: Diagnosis not present

## 2024-08-03 DIAGNOSIS — D239 Other benign neoplasm of skin, unspecified: Secondary | ICD-10-CM

## 2024-08-03 MED ORDER — CLINDAMYCIN PHOSPHATE 1 % EX SWAB: 1.0000 | Freq: Two times a day (BID) | CUTANEOUS | 5 refills | Status: AC

## 2024-08-03 MED ORDER — DOXYCYCLINE MONOHYDRATE 100 MG PO TABS: 100.0000 mg | ORAL_TABLET | Freq: Two times a day (BID) | ORAL | 2 refills | Status: DC

## 2024-08-03 NOTE — Progress Notes (Signed)
 Subjective   Gregg [name] is a 48 y.o. male who presents for the following: wart, rash abdomen, boils. Patient is new patient  Today patient reports: Wart R 4th finger ~27yr, R knee ~33yrs no hx of treatment, rash lower abdomen ~20yrs, smells, itchy, not treatment, boils inner thighs, axilla, buttocks ~77yrs, clindamycin solution  Review of Systems:    No other skin or systemic complaints except as noted in HPI or Assessment and Plan.  The following portions of the chart were reviewed this encounter and updated as appropriate: medications, allergies, medical history  Relevant Medical History:  No hx of skin cancer, no fhx of skin cancer  Objective  Well appearing patient in no apparent distress; mood and affect are within normal limits. Examination was performed of the: Focused Exam of: axilla, abdomen, right hand, right leg, inner thighs   Examination notable for: Hidradenitis Suppurativa: Firm, erythematous papulonodules and cysts in the axilla, infrapannus, groin. Erythema of infrapannus., Warts: Well-demarcated verrucous, hyperkeratotic papule(s) located on the right ring finger   Examination limited by: Undergarments and Patient deferred removal       Assessment & Plan   Verruca vulgaris - Discussed diagnosis, typical course, and treatment options for this condition - Patient opted for cryotherapy treatment at today's visit - Informed patient that multiple treatments will be necessary, study shows an average of roughly 5 treatments - Discussed usage of OTC mediplast or salicylic acid, topically, paring down, requiring several weeks to heal.  Hidradenitis suppurativa, Hurley stage 2, with some component of intertrigo in pannus  mild-moderate Chronic and persistent condition with duration or expected duration over one year. Condition is symptomatic and bothersome to patient. Patient is flaring and not currently at treatment goal.  - Discussed the chronic, relapsing nature  of this skin disease, characterized by recurring inflamed painful nodules with abscess and sinus formation, and scarring - Explained to patient that early, isolated lesions can remit with medical treatment, but once sinus tracts are established treatment options are limited and surgery is the only definitive treatment; however, recurrence rate can be up to 25% even with surgery - Start topical clindamycin 1% swabs bid - - Start doxycycline 100 mg BID  - Discussed side effects and precautions with doxycycline including taking with meal, waiting at least 30 minutes before lying down at night, increased sun sensitivity, and to stop medication if becomes pregnant or breastfeeding - Start OTC BP or hibiclens wash daily to active areas  - Start Zeasorb AF qd to lower abdomen - Can consider cosentyx, bimselx  at next visit if not improved on above  - Has hx of heart disease   Dermatofibroma- R superior knee - Reassured the patient that this is a benign scar like reaction that can occur spontaneously or after trauma.  - Lesions are asymptomatic, therefore no treatment is required.  - Instructed to let us  know if lesions become painful or bothersome.   Chronic and persistent condition with duration or expected duration over one year. Condition is symptomatic and bothersome to patient. Patient is flaring and not currently at treatment goal.   Procedures, orders, diagnosis for this visit:  VIRAL WARTS, UNSPECIFIED TYPE R ring finger x 1 Viral Wart (HPV) Counseling  Discussed viral / HPV (Human Papilloma Virus) etiology and risk of spread /infectivity to other areas of body as well as to other people.  Multiple treatments and methods may be required to clear warts and it is possible treatment may not be successful.  Treatment risks include discoloration; scarring and there is still potential for wart recurrence. Destruction of lesion - R ring finger x 1  Destruction method: cryotherapy   Informed  consent: discussed and consent obtained   Lesion destroyed using liquid nitrogen: Yes   Region frozen until ice ball extended beyond lesion: Yes   Outcome: patient tolerated procedure well with no complications   Post-procedure details: wound care instructions given   Additional details:  Prior to procedure, discussed risks of blister formation, small wound, skin dyspigmentation, or rare scar following cryotherapy. Recommend Vaseline ointment to treated areas while healing.    Viral warts, unspecified type -     Destruction of lesion  Other orders -     Clindamycin Phosphate; Apply 1 Application topically 2 (two) times daily. Apply to the affected area of skin twice daily  Dispense: 60 each; Refill: 5 -     Doxycycline Monohydrate; Take 1 tablet (100 mg total) by mouth 2 (two) times daily. Take 100 mg twice daily for 3 months take with food and drink  Dispense: 60 tablet; Refill: 2    Return to clinic: Return in about 3 months (around 11/03/2024) for wart f/u, HS f/u.  I, Gregg [name], RMA, am acting as scribe for Gregg JAYSON Kanaris, MD .   Documentation: I have reviewed the above documentation for accuracy and completeness, and I agree with the above.  Gregg JAYSON Kanaris, MD

## 2024-08-03 NOTE — Patient Instructions (Signed)
 For warts:  - We recommend an over the counter medication containing salicylic acid. There are a few examples below  - wart stick maximum strength  - curad mediplast corn, callus and wart removal  - compound W fast acting liquid     Recommend over the counter Benzole Peroxide or Hibclens to wash in shower daily  Recommend Zeasorb AF for lower abdomen     Due to recent changes in healthcare laws, you may see results of your pathology and/or laboratory studies on MyChart before the doctors have had a chance to review them. We understand that in some cases there may be results that are confusing or concerning to you. Please understand that not all results are received at the same time and often the doctors may need to interpret multiple results in order to provide you with the best plan of care or course of treatment. Therefore, we ask that you please give us  2 business days to thoroughly review all your results before contacting the office for clarification. Should we see a critical lab result, you will be contacted sooner.   If You Need Anything After Your Visit  If you have any questions or concerns for your doctor, please call our main line at [phone] and press option 4 to reach your doctor's medical assistant. If no one answers, please leave a voicemail as directed and we will return your call as soon as possible. Messages left after 4 pm will be answered the following business day.   You may also send us  a message via MyChart. We typically respond to MyChart messages within 1-2 business days.  For prescription refills, please ask your pharmacy to contact our office. Our fax number is [phone].  If you have an urgent issue when the clinic is closed that cannot wait until the next business day, you can page your doctor at the number below.    Please note that while we do our best to be available for urgent issues outside of office hours, we are not available 24/7.   If you have  an urgent issue and are unable to reach us , you may choose to seek medical care at your doctor's office, retail clinic, urgent care center, or emergency room.  If you have a medical emergency, please immediately call 911 or go to the emergency department.  Pager Numbers  - Dr. Hester: [phone]  - Dr. Jackquline: [phone]  - Dr. Claudene: [phone]   - Dr. Raymund: [phone]  In the event of inclement weather, please call our main line at [phone] for an update on the status of any delays or closures.  Dermatology Medication Tips: Please keep the boxes that topical medications come in in order to help keep track of the instructions about where and how to use these. Pharmacies typically print the medication instructions only on the boxes and not directly on the medication tubes.   If your medication is too expensive, please contact our office at [phone] option 4 or send us  a message through MyChart.   We are unable to tell what your co-pay for medications will be in advance as this is different depending on your insurance coverage. However, we may be able to find a substitute medication at lower cost or fill out paperwork to get insurance to cover a needed medication.   If a prior authorization is required to get your medication covered by your insurance company, please allow us  1-2 business days to complete this process.  Drug prices often  vary depending on where the prescription is filled and some pharmacies may offer cheaper prices.  The website www.goodrx.com contains coupons for medications through different pharmacies. The prices here do not account for what the cost may be with help from insurance (it may be cheaper with your insurance), but the website can give you the price if you did not use any insurance.  - You can print the associated coupon and take it with your prescription to the pharmacy.  - You may also stop by our office during regular business hours and  pick up a GoodRx coupon card.  - If you need your prescription sent electronically to a different pharmacy, notify our office through [facility] or by phone at [phone] option 4.     Si Usted Necesita Algo Despus de Su Visita  Tambin puede enviarnos un mensaje a travs de Clinical cytogeneticist. Por lo general respondemos a los mensajes de MyChart en el transcurso de 1 a 2 das hbiles.  Para renovar recetas, por favor pida a su farmacia que se ponga en contacto con nuestra oficina. Randi lakes de fax es [place] [phone].  Si tiene un asunto urgente cuando la clnica est cerrada y que no puede esperar hasta el siguiente da hbil, puede llamar/localizar a su doctor(a) al nmero que aparece a continuacin.   Por favor, tenga en cuenta que aunque hacemos todo lo posible para estar disponibles para asuntos urgentes fuera del horario de [place], no estamos disponibles las 24 horas del da, los 7 [address]  [address] de la [place].   Si tiene un problema urgente y no puede comunicarse con nosotros, puede optar por buscar atencin mdica  en el consultorio de su doctor(a), en una clnica privada, en un centro de atencin urgente o en una sala de emergencias.  Si tiene Engineer, drilling, por favor llame inmediatamente al 911 o vaya a la sala de emergencias.  Nmeros de bper  - Dr. Hester: [phone]  - Dra. Jackquline: [phone]  - Dr. Claudene: [phone]  - Dra. Kitts: [phone]  En caso de inclemencias del [place], por favor llame a nuestra lnea principal al [phone] para una actualizacin sobre el estado de cualquier retraso o cierre.  Consejos para la medicacin en dermatologa: Por favor, guarde las cajas en las que vienen los medicamentos de uso tpico para ayudarle a seguir las instrucciones sobre dnde y cmo usarlos. Las farmacias generalmente imprimen las instrucciones del medicamento slo en las cajas y no directamente en los tubos del [place].   Si su medicamento es muy  caro, por favor, pngase en contacto con landry rieger llamando al [phone] y presione la opcin 4 o envenos un mensaje a travs de Clinical cytogeneticist.   No podemos decirle cul ser su copago por los medicamentos por adelantado ya que esto es diferente dependiendo de la cobertura de su seguro. Sin embargo, es posible que podamos encontrar un medicamento sustituto a Audiological scientist un formulario para que el seguro cubra el medicamento que se considera necesario.   Si se requiere una autorizacin previa para que su compaa de seguros malta su medicamento, por favor permtanos de 1 a 2 das hbiles para completar este proceso.  Los precios de los medicamentos varan con frecuencia dependiendo del Environmental consultant de dnde se surte la receta y alguna farmacias pueden ofrecer precios ms baratos.  El sitio web www.goodrx.com tiene cupones para medicamentos de Health and safety inspector. Los precios aqu no tienen en cuenta lo que podra costar con la ayuda del seguro (puede  ser ms barato con su seguro), pero el sitio web puede darle el precio si no Visual merchandiser.  - Puede imprimir el cupn correspondiente y llevarlo con su receta a la farmacia.  - Tambin puede pasar por nuestra oficina durante el horario de atencin regular y Education officer, museum una tarjeta de cupones de GoodRx.  - Si necesita que su receta se enve electrnicamente a una farmacia diferente, informe a nuestra oficina a travs de MyChart de [place] o por telfono llamando al [phone] y presione la opcin 4.

## 2024-10-26 ENCOUNTER — Other Ambulatory Visit: Payer: Self-pay

## 2024-11-03 ENCOUNTER — Ambulatory Visit
